# Patient Record
Sex: Male | Born: 1970 | Race: White | Hispanic: No | Marital: Married | Smoking: Former smoker
Health system: Southern US, Community
[De-identification: ages and names within clinical notes are randomized; demographics above are authoritative.]

## PROBLEM LIST (undated history)

## (undated) DIAGNOSIS — K219 Gastro-esophageal reflux disease without esophagitis: Secondary | ICD-10-CM

## (undated) DIAGNOSIS — K4090 Unilateral inguinal hernia, without obstruction or gangrene, not specified as recurrent: Secondary | ICD-10-CM

## (undated) DIAGNOSIS — Z8249 Family history of ischemic heart disease and other diseases of the circulatory system: Secondary | ICD-10-CM

## (undated) DIAGNOSIS — M7660 Achilles tendinitis, unspecified leg: Secondary | ICD-10-CM

## (undated) DIAGNOSIS — Z8719 Personal history of other diseases of the digestive system: Secondary | ICD-10-CM

## (undated) HISTORY — DX: Achilles tendinitis, unspecified leg: M76.60

## (undated) HISTORY — DX: Unilateral inguinal hernia, without obstruction or gangrene, not specified as recurrent: K40.90

## (undated) HISTORY — DX: Family history of ischemic heart disease and other diseases of the circulatory system: Z82.49

---

## 2005-02-18 ENCOUNTER — Ambulatory Visit: Payer: Self-pay | Admitting: Family Medicine

## 2005-12-14 ENCOUNTER — Ambulatory Visit: Payer: Self-pay | Admitting: Family Medicine

## 2006-04-20 ENCOUNTER — Emergency Department (HOSPITAL_COMMUNITY): Admission: EM | Admit: 2006-04-20 | Discharge: 2006-04-20 | Payer: Self-pay | Admitting: Emergency Medicine

## 2006-11-19 ENCOUNTER — Emergency Department: Payer: Self-pay | Admitting: Emergency Medicine

## 2006-12-25 ENCOUNTER — Ambulatory Visit: Payer: Self-pay | Admitting: Family Medicine

## 2006-12-26 ENCOUNTER — Ambulatory Visit: Payer: Self-pay | Admitting: Family Medicine

## 2007-08-15 ENCOUNTER — Encounter (INDEPENDENT_AMBULATORY_CARE_PROVIDER_SITE_OTHER): Payer: Self-pay | Admitting: *Deleted

## 2008-08-18 ENCOUNTER — Ambulatory Visit: Payer: Self-pay | Admitting: Family Medicine

## 2008-08-18 DIAGNOSIS — J309 Allergic rhinitis, unspecified: Secondary | ICD-10-CM | POA: Insufficient documentation

## 2008-08-18 DIAGNOSIS — J019 Acute sinusitis, unspecified: Secondary | ICD-10-CM

## 2008-12-19 ENCOUNTER — Emergency Department (HOSPITAL_COMMUNITY): Admission: EM | Admit: 2008-12-19 | Discharge: 2008-12-19 | Payer: Self-pay | Admitting: Emergency Medicine

## 2015-04-28 ENCOUNTER — Ambulatory Visit (INDEPENDENT_AMBULATORY_CARE_PROVIDER_SITE_OTHER): Payer: BC Managed Care – PPO | Admitting: Podiatry

## 2015-04-28 ENCOUNTER — Ambulatory Visit (INDEPENDENT_AMBULATORY_CARE_PROVIDER_SITE_OTHER): Payer: BC Managed Care – PPO

## 2015-04-28 ENCOUNTER — Encounter: Payer: Self-pay | Admitting: Podiatry

## 2015-04-28 VITALS — BP 139/98 | HR 69 | Resp 16 | Ht 71.0 in | Wt 200.0 lb

## 2015-04-28 DIAGNOSIS — M722 Plantar fascial fibromatosis: Secondary | ICD-10-CM | POA: Diagnosis not present

## 2015-04-28 DIAGNOSIS — M7662 Achilles tendinitis, left leg: Secondary | ICD-10-CM | POA: Diagnosis not present

## 2015-04-28 DIAGNOSIS — M7661 Achilles tendinitis, right leg: Secondary | ICD-10-CM | POA: Diagnosis not present

## 2015-04-28 NOTE — Patient Instructions (Signed)

## 2015-04-30 ENCOUNTER — Encounter: Payer: Self-pay | Admitting: Podiatry

## 2015-04-30 NOTE — Progress Notes (Signed)
Subjective:     Patient ID: Jose Pineda, male   DOB: [birth year], 44 y.o.   MRN: [id number]  HPI 44 year old male presents the office today with concerns of bilateral foot pain with a right slightly worse than left. He states he has had problems with his wife. 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 He states the majority of his pain at this time is localized to the back of his heel for which she points the Achilles tendon. He does state the mornings when he gets of the area feels tight when he has pain to the area and as he walks some and is relieved. He is tried multiple shoe gear changes. He states he'll get a pair shoes nail to good for a little while before they wear out. Denies any history of injury or trauma to the area and denies any swelling or redness over the area. Denies any tendon or numbness. No other complaints at this time.  Review of Systems  All other systems reviewed and are negative.      Objective:   Physical Exam AAO x3, NAD DP/PT pulses palpable bilaterally, CRT less than 3 seconds Protective sensation intact with Simms Weinstein monofilament, vibratory sensation intact, Achilles tendon reflex intact There is mild tenderness to palpation along the posterior aspect of the foot overlying the Achilles tendon and along the insertion of the calcaneus. There is no defect noted within the Achilles tendon and Janee Mornhompson test is performed which is negative. There is no pain on the course last insertion of the plantar fascia. There is no other areas of tenderness to bilateral foot/ankle. There is a decrease in medial arch height upon weightbearing with the right side slightly worse than left. There is equinus present. Ankle joint, subtalar joint range of motion is intact without any pain. No other areas of tenderness to bilateral lower extremities. MMT 5/5, ROM WNL.  No open lesions or pre-ulcerative lesions.  No overlying edema,  erythema, increase in warmth to bilateral lower extremities.  No pain with calf compression, swelling, warmth, erythema bilaterally.      Assessment:     44 year old male with Achilles tendinitis bilaterally    Plan:     -X-rays were obtained and reviewed with the patient.  -Treatment options discussed including all alternatives, risks, and complications -Discussed likely etiology of his symptoms. -Discussed stretching exercises to be performed the daily basis. Ice to the area. -Dispensed night splint. -He brought and multiple shoes for evaluation. We went through appropriate shoe gear. Also discussed with him orthotics. He will likely purchase over-the-counter orthotics I discussed with him what to look form purchasing these. I recommended him to go to [facility] in [place] to be fitted for an appropriate shoe. -Follow-up 4 weeks or sooner if any problems arise. In the meantime, encouraged to call the office with any questions, concerns, change in symptoms.   Ovid CurdMatthew Bita Cartwright, DPM

## 2015-05-26 ENCOUNTER — Ambulatory Visit (INDEPENDENT_AMBULATORY_CARE_PROVIDER_SITE_OTHER): Payer: BC Managed Care – PPO | Admitting: Podiatry

## 2015-05-26 DIAGNOSIS — M7661 Achilles tendinitis, right leg: Secondary | ICD-10-CM | POA: Diagnosis not present

## 2015-05-26 DIAGNOSIS — M7662 Achilles tendinitis, left leg: Secondary | ICD-10-CM | POA: Diagnosis not present

## 2015-05-26 DIAGNOSIS — B078 Other viral warts: Secondary | ICD-10-CM

## 2015-05-26 DIAGNOSIS — L570 Actinic keratosis: Secondary | ICD-10-CM | POA: Diagnosis not present

## 2015-05-26 DIAGNOSIS — B079 Viral wart, unspecified: Secondary | ICD-10-CM | POA: Diagnosis not present

## 2015-05-28 NOTE — Progress Notes (Signed)
Patient ID: Jose Pineda, male   DOB: December 12, 1970, 44 y.o.   MRN: [id number]  Subjective: 44 year old male presents the office today for follow-up evaluation of bilateral heel pain, Achilles tendinitis which he states is "a lot better". He states that since last appointment he is continue the stretching exercises on a daily basis as well as when the night splint. He is also purchased new shoes and inserts which seems to help as well. Denies any swelling or redness. Denies any numbness or treatment. He also states on the left fourth toe he is concerned that he may have a wart. The area is painful particularly with pressure. Denies any redness or drainage. No prior treatment No other complaints at this time.   Objective: AAO 3, NAD  Neurovascular status unchanged  There is slight tenderness palpation along the posterior aspect of the calcaneus on the insertion of the Achilles tendon. There is no pain along the course of the Achilles tendon. No defect is noted. Thompson test is negative. There is mild discomfort upon palpation to the plantar medial tubercle of the calcaneus the insertion of the plantar fascia on the right side. There is no pain along the left. There is no pain along the course the plantar fascial bilaterally. No pain with lateral compression of the calcaneus.  The distal aspect of the plantar left fourth digit there is small annular hyperkeratotic lesion. Upon debridement there is a small amount of pinpoint bleeding and there is evidence of verruca. There is no surrounding erythema. No other lesions identified.  No overlying edema, erythema, increase in warmth to bilateral lower extremities. No other areas of tenderness to bilateral lower extremity.  No pain with calf compression, swelling, warmth, erythema.   Assessment:  44 year old male with resolving heel pain; left fourth digit verruca   Plan: -Treatment options discussed including all alternatives, risks, and complications -In  regards the heel pain recommended continue stretching, icing activities as well as shoe gear modifications and orthotics. For the plantar fasciitis. Did discuss a steroid injection however he wishes to hold off on that at this time. I asked discussed a Medrol Dosepak he wishes to hold off. Continue anti-inflammatories as needed. -The lesion on the left fourth digit sharply debrided. Cantharone was applied followed by an occlusive bandage. Post procedure instructions were discussed with the patient. Monitor for any clinical signs or symptoms of infection and directed to call the office immediately should any occur or go to the ER. -Follow-up 4 weeks or sooner if any problems arise. In the meantime, encouraged to call the office with any questions, concerns, change in symptoms.   Ovid Curd, DPM

## 2015-06-25 ENCOUNTER — Ambulatory Visit (INDEPENDENT_AMBULATORY_CARE_PROVIDER_SITE_OTHER): Payer: BC Managed Care – PPO | Admitting: Podiatry

## 2015-06-25 ENCOUNTER — Encounter: Payer: Self-pay | Admitting: Podiatry

## 2015-06-25 VITALS — BP 122/73 | HR 77 | Resp 18

## 2015-06-25 DIAGNOSIS — M7662 Achilles tendinitis, left leg: Secondary | ICD-10-CM

## 2015-06-25 DIAGNOSIS — B070 Plantar wart: Secondary | ICD-10-CM | POA: Diagnosis not present

## 2015-06-25 DIAGNOSIS — M7661 Achilles tendinitis, right leg: Secondary | ICD-10-CM | POA: Diagnosis not present

## 2015-06-25 DIAGNOSIS — M722 Plantar fascial fibromatosis: Secondary | ICD-10-CM

## 2015-06-25 DIAGNOSIS — B078 Other viral warts: Secondary | ICD-10-CM

## 2015-06-25 DIAGNOSIS — B079 Viral wart, unspecified: Secondary | ICD-10-CM

## 2015-06-25 MED ORDER — METHYLPREDNISOLONE 4 MG PO TBPK: ORAL_TABLET | ORAL | Status: DC

## 2015-06-25 NOTE — Progress Notes (Signed)
Patient ID: Jose Pineda, male   DOB: 12-14-1970, 44 y.o.   MRN: [id number]  Subjective: 44 year old male presents the office today for follow-up evaluation of bilateral heel pain, Achilles tendinitis. He states that overall he is doing better although he does continue to have some discomfort to the bottoms and back of his heel mostly on the right side. He is also returned back to work over the last 2 weeks he is on his feet more. He is purchased new shoes as well as inserts and continue stretching icing activities. He denies any swelling or redness. No numbness or tingling. Also states he continues to have the wart on the left fourth toe although smaller denies any pain to the area. Had no problems at the last Cantharone application. Denies any drainage or redness from the area. No other complaints at this time.  Objective: AAO 3, NAD  Neurovascular status unchanged   there is tenderness to palpation overlying the plantar medial tubercle of the calcaneus at the insertion the plantar fascia mostly on the right foot and to a lesser degree the left foot. There is no pain on the course of plantar fascial bilaterally and the plantar fascia appears to be intact. There is mild discomfort on the posterior aspect of the calcaneus on the insertion of the Achilles tendon on the right foot. There is mild equinus present. There is no defect noted in Kilgore test is negative. The distal aspect of the plantar left fourth digit there is small annular hyperkeratotic lesion. Upon debridement there is  pinpoint bleeding and there is evidence of verruca. The area does appear to be smaller. There is no surrounding erythema. No other lesions identified.  No overlying edema, erythema, increase in warmth to bilateral lower extremities.  No other areas of tenderness to bilateral lower extremity.  No pain with calf compression, swelling, warmth, erythema.   Assessment: 44 year old male with resolving heel pain; left fourth  digit verruca   Plan: -Treatment options discussed including all alternatives, risks, and complications -For the heel pain I discussed him steroid injections however he wishes to hold off. Discussed with him Medrol Dosepak which she would like to try. Prescribed this to him. Discussed risks, locations directed to call the office if any occur. Continue his shoe gear modifications and orthotics. Continue stretching icing and activity daily. -The lesion on the left fourth digit was sharply debrided. Cantharone was applied followed by an occlusive bandage. Post procedure instructions were discussed with the patient. Monitor for any clinical signs or symptoms of infection and directed to call the office immediately should any occur or go to the ER. -Follow-up 4 weeks if symptoms continue  or sooner if any problems arise. In the meantime, encouraged to call the office with any questions, concerns, change in symptoms.   Ovid Curd, DPM

## 2015-07-17 ENCOUNTER — Other Ambulatory Visit: Payer: Self-pay | Admitting: Gastroenterology

## 2015-07-17 ENCOUNTER — Other Ambulatory Visit: Payer: Self-pay | Admitting: Physician Assistant

## 2015-07-17 ENCOUNTER — Other Ambulatory Visit (HOSPITAL_COMMUNITY): Payer: Self-pay | Admitting: Gastroenterology

## 2015-07-17 DIAGNOSIS — K219 Gastro-esophageal reflux disease without esophagitis: Secondary | ICD-10-CM

## 2015-07-23 ENCOUNTER — Ambulatory Visit: Payer: BC Managed Care – PPO | Admitting: Podiatry

## 2015-07-30 NOTE — Addendum Note (Signed)
Addended by: Dorena Cookey on: 07/30/2015 01:06 PM   Modules accepted: Orders

## 2015-08-03 ENCOUNTER — Encounter (HOSPITAL_COMMUNITY)
Admission: RE | Disposition: A | Discharge status: Home / Self Care | Payer: Self-pay | Source: Ambulatory Visit | Attending: Gastroenterology

## 2015-08-03 ENCOUNTER — Ambulatory Visit (HOSPITAL_COMMUNITY)
Admission: RE | Admit: 2015-08-03 | Discharge: 2015-08-03 | Disposition: A | Discharge status: Home / Self Care | Payer: BC Managed Care – PPO | Source: Ambulatory Visit | Attending: Gastroenterology | Admitting: Gastroenterology

## 2015-08-03 DIAGNOSIS — K224 Dyskinesia of esophagus: Secondary | ICD-10-CM | POA: Insufficient documentation

## 2015-08-03 HISTORY — PX: ESOPHAGEAL MANOMETRY: SHX5429

## 2015-08-03 SURGERY — MANOMETRY, ESOPHAGUS

## 2015-08-03 MED ORDER — LIDOCAINE VISCOUS 2 % MT SOLN
OROMUCOSAL | Status: AC
  Filled 2015-08-03: qty 15

## 2015-08-03 SURGICAL SUPPLY — 2 items
FACESHIELD LNG OPTICON STERILE (SAFETY) IMPLANT
GLOVE BIO SURGEON STRL SZ8 (GLOVE) ×6 IMPLANT

## 2015-08-04 ENCOUNTER — Encounter (HOSPITAL_COMMUNITY): Payer: Self-pay | Admitting: Gastroenterology

## 2015-08-14 ENCOUNTER — Other Ambulatory Visit: Payer: Self-pay | Admitting: Surgery

## 2015-08-14 DIAGNOSIS — K2100 Gastro-esophageal reflux disease with esophagitis, without bleeding: Secondary | ICD-10-CM

## 2015-08-14 DIAGNOSIS — K449 Diaphragmatic hernia without obstruction or gangrene: Secondary | ICD-10-CM

## 2015-08-21 ENCOUNTER — Ambulatory Visit
Admission: RE | Admit: 2015-08-21 | Discharge: 2015-08-21 | Disposition: A | Discharge status: Home / Self Care | Payer: BC Managed Care – PPO | Source: Ambulatory Visit | Attending: Surgery | Admitting: Surgery

## 2015-08-21 DIAGNOSIS — K449 Diaphragmatic hernia without obstruction or gangrene: Secondary | ICD-10-CM

## 2015-08-21 DIAGNOSIS — K2100 Gastro-esophageal reflux disease with esophagitis, without bleeding: Secondary | ICD-10-CM

## 2015-10-12 NOTE — Patient Instructions (Addendum)
Jose Pineda  10/12/2015   Your procedure is scheduled on: 10/16/2015    [facility]  [place]tr[place]vators to 3rd floor to  Short Stay Center at     0530 AM.  Call this number if you have problems the morning of surgery [phone]   Remember: ONLY 1 PERSON MAY GO WITH YOU TO SHORT STAY TO GET  READY MORNING OF YOUR SURGERY.  Do not eat food or drink liquids :After Midnight.     Take these medicines the morning of surgery with A SIP OF WATER: Zyrtec if needed, Flonase if needed, Protonix                                 You may not have any metal on your body including hair pins and              piercings  Do not wear jewelry,  lotions, powders or perfumes, deodorant                          Men may shave face and neck.   Do not bring valuables to the hospital. [facility] IS NOT             RESPONSIBLE   FOR VALUABLES.  Contacts, dentures or bridgework may not be worn into surgery.  Leave suitcase in the car. After surgery it may be brought to your room.       Special Instructions: coughing and deep breathing exercises, leg exercises               Please read over the following fact sheets you were given: _____________________________________________________________________             [facility] - Preparing for Surgery Before surgery, you can play an important role.  Because skin is not sterile, your skin needs to be as free of germs as possible.  You can reduce the number of germs on your skin by washing with CHG (chlorahexidine gluconate) soap before surgery.  CHG is an antiseptic cleaner which kills germs and bonds with the skin to continue killing germs even after washing. Please DO NOT use if you have an allergy to CHG or antibacterial soaps.  If your skin becomes reddened/irritated stop using the CHG and inform your nurse when you arrive at Short Stay. Do not shave (including legs and underarms) for at least 48 hours  prior to the first CHG shower.  You may shave your face/neck. Please follow these instructions carefully:  1.  Shower with CHG Soap the night before surgery and the  morning of Surgery.  2.  If you choose to wash your hair, wash your hair first as usual with your  normal  shampoo.  3.  After you shampoo, rinse your hair and body thoroughly to remove the  shampoo.                           4.  Use CHG as you would any other liquid soap.  You can apply chg directly  to the skin and wash                       Gently with a scrungie  or clean washcloth.  5.  Apply the CHG Soap to your body ONLY FROM THE NECK DOWN.   Do not use on face/ open                           Wound or open sores. Avoid contact with eyes, ears mouth and genitals (private parts).                       Wash face,  Genitals (private parts) with your normal soap.             6.  Wash thoroughly, paying special attention to the area where your surgery  will be performed.  7.  Thoroughly rinse your body with warm water from the neck down.  8.  DO NOT shower/wash with your normal soap after using and rinsing off  the CHG Soap.                9.  Pat yourself dry with a clean towel.            10.  Wear clean pajamas.            11.  Place clean sheets on your bed the night of your first shower and do not  sleep with pets. Day of Surgery : Do not apply any lotions/deodorants the morning of surgery.  Please wear clean clothes to the hospital/surgery center.  FAILURE TO FOLLOW THESE INSTRUCTIONS MAY RESULT IN THE CANCELLATION OF YOUR SURGERY PATIENT SIGNATURE_________________________________  NURSE SIGNATURE__________________________________  ________________________________________________________________________

## 2015-10-13 NOTE — Anesthesia Preprocedure Evaluation (Addendum)
Anesthesia Evaluation  Patient identified by MRN, date of birth, ID band Patient awake    Reviewed: Allergy & Precautions, NPO status , Patient's Chart, lab work & pertinent test results  Airway Mallampati: II   Neck ROM: Full    Dental  (+) Dental Advisory Given   Pulmonary neg pulmonary ROS, former smoker,    breath sounds clear to auscultation       Cardiovascular negative cardio ROS   Rhythm:Regular     Neuro/Psych negative neurological ROS  negative psych ROS   GI/Hepatic Neg liver ROS, hiatal hernia, GERD  Medicated,  Endo/Other  negative endocrine ROS  Renal/GU negative Renal ROS  negative genitourinary   Musculoskeletal negative musculoskeletal ROS (+)   Abdominal (+)  Abdomen: soft.    Peds negative pediatric ROS (+)  Hematology negative hematology ROS (+) 14/42   Anesthesia Other Findings   Reproductive/Obstetrics negative OB ROS                           Anesthesia Physical Anesthesia Plan  ASA: II  Anesthesia Plan: General   Post-op Pain Management:    Induction: Intravenous  Airway Management Planned: Oral ETT  Additional Equipment:   Intra-op Plan:   Post-operative Plan: Extubation in OR  Informed Consent: I have reviewed the patients History and Physical, chart, labs and discussed the procedure including the risks, benefits and alternatives for the proposed anesthesia with the patient or authorized representative who has indicated his/her understanding and acceptance.     Plan Discussed with:   Anesthesia Plan Comments:         Anesthesia Quick Evaluation

## 2015-10-14 ENCOUNTER — Ambulatory Visit: Payer: Self-pay | Admitting: Surgery

## 2015-10-14 ENCOUNTER — Encounter (HOSPITAL_COMMUNITY)
Admission: RE | Admit: 2015-10-14 | Discharge: 2015-10-14 | Disposition: A | Discharge status: Home / Self Care | Payer: BC Managed Care – PPO | Source: Ambulatory Visit | Attending: Surgery | Admitting: Surgery

## 2015-10-14 ENCOUNTER — Encounter (HOSPITAL_COMMUNITY): Payer: Self-pay

## 2015-10-14 HISTORY — DX: Gastro-esophageal reflux disease without esophagitis: K21.9

## 2015-10-14 HISTORY — DX: Personal history of other diseases of the digestive system: Z87.19

## 2015-10-14 LAB — CBC
HCT: 42.1 % (ref 39.0–52.0)
Hemoglobin: 14.5 g/dL (ref 13.0–17.0)
MCH: 30.7 pg (ref 26.0–34.0)
MCHC: 34.4 g/dL (ref 30.0–36.0)
MCV: 89 fL (ref 78.0–100.0)
PLATELETS: 265 10*3/uL (ref 150–400)
RBC: 4.73 MIL/uL (ref 4.22–5.81)
RDW: 13.1 % (ref 11.5–15.5)
WBC: 8.1 10*3/uL (ref 4.0–10.5)

## 2015-10-14 NOTE — Progress Notes (Signed)
Patient 's mother recently in hospital with numerous blood clots per patient. Also patient has family history of blood clots in family.

## 2015-10-14 NOTE — H&P (Signed)
Jose Pineda 08/12/2015 4:08 PM Location: [facility] Patient #: [id number] DOB: 06/25/1971 Married / Language: English / Race: White Male   History of Present Illness  The patient is a 44 year old male who presents with a hiatal hernia. 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 All her by Dr. Amy Bedsole and was seen by Dr. Hayes who referred him. He has short segment Barrett's and GERD and wants to try to get off the PPIs that he is on every day. He has reflux all the time worse sometimes with exercise. He's never had any prior abdominal surgery. I went over Nissen fundoplication and laparoscopic hiatal hernia repair with him in detail drawing them pictures and also giving him a booklet. We talked about potential risks and complications. He wants to go ahead and get scheduled. Before we do this I will like to get an upper GI series. He comes with a manometry done that shows some weekend a lower esophageal sphincter and some generally not great peristalsis waves.   Other Problems  Gastroesophageal Reflux Disease  Past Surgical History No pertinent past surgical history  Diagnostic Studies History  Colonoscopy never  Allergies  No Known Drug Allergies10/10/2015  Medication History  Pantoprazole Sodium (40MG Tablet DR, Oral) Active. Ranitidine HCl (150MG Capsule, Oral) Active. Medications Reconciled  Social History  Caffeine use Coffee. No alcohol use No drug use Tobacco use Former smoker.  Family History Arthritis Father, Mother. Cancer Mother. Diabetes Mellitus Father. Hypertension Father.    Review of Systems (Chemira Jones CMA; 08/12/2015 4:08 PM) General Not Present- Appetite Loss, Chills, Fatigue, Fever, Night Sweats, Weight Gain and Weight Loss. Skin Not Present- Change in Wart/Mole, Dryness, Hives, Jaundice, New Lesions, Non-Healing Wounds, Rash and Ulcer. HEENT Not  Present- Earache, Hearing Loss, Hoarseness, Nose Bleed, Oral Ulcers, Ringing in the Ears, Seasonal Allergies, Sinus Pain, Sore Throat, Visual Disturbances, Wears glasses/contact lenses and Yellow Eyes. Respiratory Not Present- Bloody sputum, Chronic Cough, Difficulty Breathing, Snoring and Wheezing. Breast Not Present- Breast Mass, Breast Pain, Nipple Discharge and Skin Changes. Cardiovascular Not Present- Chest Pain, Difficulty Breathing Lying Down, Leg Cramps, Palpitations, Rapid Heart Rate, Shortness of Breath and Swelling of Extremities. Gastrointestinal Not Present- Abdominal Pain, Bloating, Bloody Stool, Change in Bowel Habits, Chronic diarrhea, Constipation, Difficulty Swallowing, Excessive gas, Gets full quickly at meals, Hemorrhoids, Indigestion, Nausea, Rectal Pain and Vomiting. Male Genitourinary Not Present- Blood in Urine, Change in Urinary Stream, Frequency, Impotence, Nocturia, Painful Urination, Urgency and Urine Leakage. Musculoskeletal Not Present- Back Pain, Joint Pain, Joint Stiffness, Muscle Pain, Muscle Weakness and Swelling of Extremities. Neurological Not Present- Decreased Memory, Fainting, Headaches, Numbness, Seizures, Tingling, Tremor, Trouble walking and Weakness. Psychiatric Not Present- Anxiety, Bipolar, Change in Sleep Pattern, Depression, Fearful and Frequent crying. Endocrine Not Present- Cold Intolerance, Excessive Hunger, Hair Changes, Heat Intolerance, Hot flashes and New Diabetes. Hematology Not Present- Easy Bruising, Excessive bleeding, Gland problems, HIV and Persistent Infections.  Weight: 209.2 lb Height: 71in Body Surface Area: 2.18 m Body Mass Index: 29.18 kg/m      Physical ExamGeneral Note: HEENT normal Neck supple Chest clear Heart SR without murmurs Abdomen nontender Ext FROM without edema or clubbing Neuro alert and oriented x 3     Assessment & Plan  HIATAL HERNIA WITH GERD AND ESOPHAGITIS (K44.9)--plan lap Nissen  fundoplication  Matt B. Cynthia Stainback, MD, FACS  [facility] [phone] beeper [phone]  10/14/2015 3:55 PM   

## 2015-10-14 NOTE — Progress Notes (Signed)
Patient developed head cold prior to preop appointment on 10/14/2015.  Patient states Dr Daphine DeutscherMartin office aware.  Instructed patient to notify Dr Daphine DeutscherMartin and to go to Urgent Care if symptoms become worse.

## 2015-10-16 ENCOUNTER — Encounter (HOSPITAL_COMMUNITY): Payer: Self-pay | Admitting: *Deleted

## 2015-10-16 ENCOUNTER — Inpatient Hospital Stay (HOSPITAL_COMMUNITY): Payer: BC Managed Care – PPO | Admitting: Anesthesiology

## 2015-10-16 ENCOUNTER — Inpatient Hospital Stay (HOSPITAL_COMMUNITY)
Admission: RE | Admit: 2015-10-16 | Discharge: 2015-10-19 | DRG: 328 | Disposition: A | Discharge status: Home / Self Care | Payer: BC Managed Care – PPO | Source: Ambulatory Visit | Attending: Surgery | Admitting: Surgery

## 2015-10-16 ENCOUNTER — Encounter (HOSPITAL_COMMUNITY)
Admission: RE | Disposition: A | Discharge status: Home / Self Care | Payer: Self-pay | Source: Ambulatory Visit | Attending: Surgery

## 2015-10-16 DIAGNOSIS — Z833 Family history of diabetes mellitus: Secondary | ICD-10-CM

## 2015-10-16 DIAGNOSIS — Z809 Family history of malignant neoplasm, unspecified: Secondary | ICD-10-CM | POA: Diagnosis not present

## 2015-10-16 DIAGNOSIS — K210 Gastro-esophageal reflux disease with esophagitis: Secondary | ICD-10-CM | POA: Diagnosis present

## 2015-10-16 DIAGNOSIS — Z87891 Personal history of nicotine dependence: Secondary | ICD-10-CM

## 2015-10-16 DIAGNOSIS — K2270 Barrett's esophagus without dysplasia: Secondary | ICD-10-CM | POA: Diagnosis present

## 2015-10-16 DIAGNOSIS — Z8249 Family history of ischemic heart disease and other diseases of the circulatory system: Secondary | ICD-10-CM

## 2015-10-16 DIAGNOSIS — K449 Diaphragmatic hernia without obstruction or gangrene: Principal | ICD-10-CM | POA: Diagnosis present

## 2015-10-16 DIAGNOSIS — Z01812 Encounter for preprocedural laboratory examination: Secondary | ICD-10-CM | POA: Diagnosis not present

## 2015-10-16 DIAGNOSIS — Z9889 Other specified postprocedural states: Secondary | ICD-10-CM

## 2015-10-16 HISTORY — PX: LAPAROSCOPIC NISSEN FUNDOPLICATION: SHX1932

## 2015-10-16 LAB — CBC
HEMATOCRIT: 40.3 % (ref 39.0–52.0)
Hemoglobin: 13.7 g/dL (ref 13.0–17.0)
MCH: 30.4 pg (ref 26.0–34.0)
MCHC: 34 g/dL (ref 30.0–36.0)
MCV: 89.6 fL (ref 78.0–100.0)
PLATELETS: 233 10*3/uL (ref 150–400)
RBC: 4.5 MIL/uL (ref 4.22–5.81)
RDW: 13.3 % (ref 11.5–15.5)
WBC: 11.8 10*3/uL — AB (ref 4.0–10.5)

## 2015-10-16 LAB — CREATININE, SERUM
Creatinine, Ser: 0.91 mg/dL (ref 0.61–1.24)
GFR calc non Af Amer: >60 mL/min (ref >60)

## 2015-10-16 SURGERY — FUNDOPLICATION, NISSEN, LAPAROSCOPIC
Anesthesia: General

## 2015-10-16 MED ORDER — SODIUM CHLORIDE 0.9 % IJ SOLN
INTRAMUSCULAR | Status: AC
Start: 2015-10-16 — End: 2015-10-16
  Filled 2015-10-16: qty 10

## 2015-10-16 MED ORDER — DIPHENHYDRAMINE HCL 50 MG/ML IJ SOLN: 25.0000 mg | Freq: Four times a day (QID) | INTRAMUSCULAR | Status: DC | PRN

## 2015-10-16 MED ORDER — ONDANSETRON 4 MG PO TBDP: 4.0000 mg | ORAL_TABLET | Freq: Four times a day (QID) | ORAL | Status: DC | PRN

## 2015-10-16 MED ORDER — ONDANSETRON HCL 4 MG/2ML IJ SOLN
4.0000 mg | Freq: Four times a day (QID) | INTRAMUSCULAR | Status: DC | PRN
  Administered 2015-10-16: 4 mg via INTRAVENOUS
  Filled 2015-10-16: qty 2

## 2015-10-16 MED ORDER — ONDANSETRON HCL 4 MG/2ML IJ SOLN
INTRAMUSCULAR | Status: AC
  Filled 2015-10-16: qty 2

## 2015-10-16 MED ORDER — BUPIVACAINE LIPOSOME 1.3 % IJ SUSP
20.0000 mL | Freq: Once | INTRAMUSCULAR | Status: AC
  Administered 2015-10-16: 20 mL
  Filled 2015-10-16: qty 20

## 2015-10-16 MED ORDER — DIPHENHYDRAMINE HCL 25 MG PO CAPS: 25.0000 mg | ORAL_CAPSULE | Freq: Four times a day (QID) | ORAL | Status: DC | PRN

## 2015-10-16 MED ORDER — HEPARIN SODIUM (PORCINE) 5000 UNIT/ML IJ SOLN
5000.0000 [IU] | Freq: Once | INTRAMUSCULAR | Status: AC
  Administered 2015-10-16: 5000 [IU] via SUBCUTANEOUS
  Filled 2015-10-16: qty 1

## 2015-10-16 MED ORDER — CHLORHEXIDINE GLUCONATE 4 % EX LIQD: 1.0000 "application " | Freq: Once | CUTANEOUS | Status: DC

## 2015-10-16 MED ORDER — FENTANYL CITRATE (PF) 100 MCG/2ML IJ SOLN: 25.0000 ug | INTRAMUSCULAR | Status: DC | PRN

## 2015-10-16 MED ORDER — MIDAZOLAM HCL 5 MG/5ML IJ SOLN
INTRAMUSCULAR | Status: DC | PRN
  Administered 2015-10-16: 2 mg via INTRAVENOUS

## 2015-10-16 MED ORDER — LACTATED RINGERS IR SOLN
Status: DC | PRN
  Administered 2015-10-16: 1

## 2015-10-16 MED ORDER — CEFOTETAN DISODIUM-DEXTROSE 2-2.08 GM-% IV SOLR
INTRAVENOUS | Status: AC
  Filled 2015-10-16: qty 50

## 2015-10-16 MED ORDER — PROMETHAZINE HCL 25 MG/ML IJ SOLN: 6.2500 mg | INTRAMUSCULAR | Status: DC | PRN

## 2015-10-16 MED ORDER — PROPOFOL 10 MG/ML IV BOLUS
INTRAVENOUS | Status: AC
  Filled 2015-10-16: qty 20

## 2015-10-16 MED ORDER — POTASSIUM CHLORIDE IN NACL 20-0.45 MEQ/L-% IV SOLN
INTRAVENOUS | Status: DC
  Administered 2015-10-16: 100 mL/h via INTRAVENOUS
  Administered 2015-10-17 – 2015-10-18 (×2): via INTRAVENOUS
  Filled 2015-10-16 (×8): qty 1000

## 2015-10-16 MED ORDER — SODIUM CHLORIDE 0.9 % IJ SOLN
INTRAMUSCULAR | Status: DC | PRN
  Administered 2015-10-16: 10 mL via INTRAVENOUS

## 2015-10-16 MED ORDER — HEPARIN SODIUM (PORCINE) 5000 UNIT/ML IJ SOLN
5000.0000 [IU] | Freq: Three times a day (TID) | INTRAMUSCULAR | Status: DC
  Administered 2015-10-16 – 2015-10-19 (×9): 5000 [IU] via SUBCUTANEOUS
  Filled 2015-10-16 (×12): qty 1

## 2015-10-16 MED ORDER — ROCURONIUM BROMIDE 100 MG/10ML IV SOLN
INTRAVENOUS | Status: AC
  Filled 2015-10-16: qty 1

## 2015-10-16 MED ORDER — ACETAMINOPHEN 10 MG/ML IV SOLN
1000.0000 mg | Freq: Four times a day (QID) | INTRAVENOUS | Status: AC
  Administered 2015-10-16 – 2015-10-17 (×4): 1000 mg via INTRAVENOUS
  Filled 2015-10-16 (×4): qty 100

## 2015-10-16 MED ORDER — LIDOCAINE HCL (CARDIAC) 20 MG/ML IV SOLN
INTRAVENOUS | Status: DC | PRN
  Administered 2015-10-16: 100 mg via INTRAVENOUS

## 2015-10-16 MED ORDER — MEPERIDINE HCL 50 MG/ML IJ SOLN: 6.2500 mg | INTRAMUSCULAR | Status: DC | PRN

## 2015-10-16 MED ORDER — ONDANSETRON HCL 4 MG/2ML IJ SOLN
INTRAMUSCULAR | Status: DC | PRN
  Administered 2015-10-16: 4 mg via INTRAVENOUS

## 2015-10-16 MED ORDER — PROPOFOL 10 MG/ML IV BOLUS
INTRAVENOUS | Status: DC | PRN
  Administered 2015-10-16: 150 mg via INTRAVENOUS

## 2015-10-16 MED ORDER — MIDAZOLAM HCL 2 MG/2ML IJ SOLN
INTRAMUSCULAR | Status: AC
  Filled 2015-10-16: qty 2

## 2015-10-16 MED ORDER — SUGAMMADEX SODIUM 200 MG/2ML IV SOLN
INTRAVENOUS | Status: AC
  Filled 2015-10-16: qty 2

## 2015-10-16 MED ORDER — CEFOTETAN DISODIUM-DEXTROSE 2-2.08 GM-% IV SOLR
2.0000 g | INTRAVENOUS | Status: AC
  Administered 2015-10-16: 2 g via INTRAVENOUS

## 2015-10-16 MED ORDER — SUFENTANIL CITRATE 50 MCG/ML IV SOLN
INTRAVENOUS | Status: AC
  Filled 2015-10-16: qty 1

## 2015-10-16 MED ORDER — PANTOPRAZOLE SODIUM 40 MG IV SOLR
40.0000 mg | Freq: Every day | INTRAVENOUS | Status: DC
  Administered 2015-10-16 – 2015-10-18 (×3): 40 mg via INTRAVENOUS
  Filled 2015-10-16 (×4): qty 40

## 2015-10-16 MED ORDER — 0.9 % SODIUM CHLORIDE (POUR BTL) OPTIME
TOPICAL | Status: DC | PRN
  Administered 2015-10-16: 1000 mL

## 2015-10-16 MED ORDER — LACTATED RINGERS IV SOLN
INTRAVENOUS | Status: DC | PRN
  Administered 2015-10-16 (×2): via INTRAVENOUS

## 2015-10-16 MED ORDER — EPHEDRINE SULFATE 50 MG/ML IJ SOLN
INTRAMUSCULAR | Status: DC | PRN
  Administered 2015-10-16: 10 mg via INTRAVENOUS

## 2015-10-16 MED ORDER — GLYCOPYRROLATE 0.2 MG/ML IJ SOLN
INTRAMUSCULAR | Status: DC | PRN
  Administered 2015-10-16: 0.2 mg via INTRAVENOUS

## 2015-10-16 MED ORDER — SUGAMMADEX SODIUM 200 MG/2ML IV SOLN
INTRAVENOUS | Status: DC | PRN
  Administered 2015-10-16: 195 mg via INTRAVENOUS

## 2015-10-16 MED ORDER — ROCURONIUM BROMIDE 100 MG/10ML IV SOLN
INTRAVENOUS | Status: DC | PRN
  Administered 2015-10-16 (×2): 10 mg via INTRAVENOUS
  Administered 2015-10-16: 20 mg via INTRAVENOUS
  Administered 2015-10-16: 40 mg via INTRAVENOUS

## 2015-10-16 MED ORDER — HYDROMORPHONE HCL 1 MG/ML IJ SOLN
0.5000 mg | INTRAMUSCULAR | Status: DC | PRN
  Administered 2015-10-16 – 2015-10-19 (×6): 0.5 mg via INTRAVENOUS
  Filled 2015-10-16 (×6): qty 1

## 2015-10-16 MED ORDER — LIDOCAINE HCL (CARDIAC) 20 MG/ML IV SOLN
INTRAVENOUS | Status: AC
  Filled 2015-10-16: qty 5

## 2015-10-16 MED ORDER — SODIUM CHLORIDE 0.9 % IJ SOLN
INTRAMUSCULAR | Status: AC
  Filled 2015-10-16: qty 20

## 2015-10-16 MED ORDER — SUFENTANIL CITRATE 50 MCG/ML IV SOLN
INTRAVENOUS | Status: DC | PRN
  Administered 2015-10-16 (×2): 10 ug via INTRAVENOUS
  Administered 2015-10-16 (×6): 5 ug via INTRAVENOUS

## 2015-10-16 SURGICAL SUPPLY — 43 items
APPLIER CLIP ROT 10 11.4 M/L (STAPLE)
CABLE HIGH FREQUENCY MONO STRZ (ELECTRODE) ×3 IMPLANT
CLAMP ENDO BABCK 10MM (STAPLE) IMPLANT
CLIP APPLIE ROT 10 11.4 M/L (STAPLE) IMPLANT
COVER SURGICAL LIGHT HANDLE (MISCELLANEOUS) ×3 IMPLANT
DECANTER SPIKE VIAL GLASS SM (MISCELLANEOUS) ×3 IMPLANT
DEVICE SUT QUICK LOAD TK 5 (STAPLE) ×14 IMPLANT
DEVICE SUT TI-KNOT TK 5X26 (MISCELLANEOUS) ×2 IMPLANT
DEVICE SUTURE ENDOST 10MM (ENDOMECHANICALS) ×3 IMPLANT
DEVICE TI KNOT TK5 (MISCELLANEOUS) ×1
DEVICE TROCAR PUNCTURE CLOSURE (ENDOMECHANICALS) ×3 IMPLANT
DISSECTOR BLUNT TIP ENDO 5MM (MISCELLANEOUS) ×3 IMPLANT
DRAIN PENROSE 18X1/2 LTX STRL (DRAIN) ×3 IMPLANT
DRAPE LAPAROSCOPIC ABDOMINAL (DRAPES) ×3 IMPLANT
ELECT REM PT RETURN 9FT ADLT (ELECTROSURGICAL) ×3
ELECTRODE REM PT RTRN 9FT ADLT (ELECTROSURGICAL) ×1 IMPLANT
GLOVE BIOGEL M 8.0 STRL (GLOVE) ×3 IMPLANT
GOWN STRL REUS W/TWL XL LVL3 (GOWN DISPOSABLE) ×12 IMPLANT
GRASPER ENDO BABCOCK 10 (MISCELLANEOUS) IMPLANT
GRASPER ENDO BABCOCK 10MM (MISCELLANEOUS)
KIT BASIN OR (CUSTOM PROCEDURE TRAY) ×3 IMPLANT
QUICK LOAD TK 5 (STAPLE) ×7
SCISSORS LAP 5X45 EPIX DISP (ENDOMECHANICALS) ×3 IMPLANT
SET IRRIG TUBING LAPAROSCOPIC (IRRIGATION / IRRIGATOR) ×3 IMPLANT
SHEARS HARMONIC ACE PLUS 45CM (MISCELLANEOUS) ×3 IMPLANT
SLEEVE ADV FIXATION 12X100MM (TROCAR) IMPLANT
SLEEVE ADV FIXATION 5X100MM (TROCAR) ×9 IMPLANT
STAPLER VISISTAT 35W (STAPLE) ×3 IMPLANT
SUT SURGIDAC NAB ES-9 0 48 120 (SUTURE) ×24 IMPLANT
SUT VIC AB 4-0 SH 18 (SUTURE) ×3 IMPLANT
TIP INNERVISION DETACH 40FR (MISCELLANEOUS) IMPLANT
TIP INNERVISION DETACH 50FR (MISCELLANEOUS) IMPLANT
TIP INNERVISION DETACH 56FR (MISCELLANEOUS) ×3 IMPLANT
TIPS INNERVISION DETACH 40FR (MISCELLANEOUS)
TOWEL OR 17X26 10 PK STRL BLUE (TOWEL DISPOSABLE) ×6 IMPLANT
TOWEL OR NON WOVEN STRL DISP B (DISPOSABLE) IMPLANT
TRAY FOLEY W/METER SILVER 14FR (SET/KITS/TRAYS/PACK) IMPLANT
TRAY FOLEY W/METER SILVER 16FR (SET/KITS/TRAYS/PACK) IMPLANT
TRAY LAPAROSCOPIC (CUSTOM PROCEDURE TRAY) ×3 IMPLANT
TROCAR ADV FIXATION 12X100MM (TROCAR) ×3 IMPLANT
TROCAR ADV FIXATION 5X100MM (TROCAR) ×3 IMPLANT
TROCAR BLADELESS OPT 5 100 (ENDOMECHANICALS) ×3 IMPLANT
TUBING FILTER THERMOFLATOR (ELECTROSURGICAL) ×3 IMPLANT

## 2015-10-16 NOTE — H&P (View-Only) (Signed)
Jose HuhAlan K. Jose Pineda 08/12/2015 4:08 PM Location: [facility] Patient #: [id number] DOB: 1970/12/31 Married / Language: English / Race: White Male   History of Present Illness  The patient is a 44 year old male who presents with a hiatal hernia. 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 All her by Dr. Kerby NoraAmy Pineda and was seen by Dr. Madilyn FiremanHayes who referred him. He has short segment Barrett's and GERD and wants to try to get off the PPIs that he is on every day. He has reflux all the time worse sometimes with exercise. He's never had any prior abdominal surgery. I went over Nissen fundoplication and laparoscopic hiatal hernia repair with him in detail drawing them pictures and also giving him a booklet. We talked about potential risks and complications. He wants to go ahead and get scheduled. Before we do this I will like to get an upper GI series. He comes with a manometry done that shows some weekend a lower esophageal sphincter and some generally not great peristalsis waves.   Other Problems  Gastroesophageal Reflux Disease  Past Surgical History No pertinent past surgical history  Diagnostic Studies History  Colonoscopy never  Allergies  No Known Drug Allergies10/10/2015  Medication History  Pantoprazole Sodium (40MG  Tablet DR, Oral) Active. Ranitidine HCl (150MG  Capsule, Oral) Active. Medications Reconciled  Social History  Caffeine use Coffee. No alcohol use No drug use Tobacco use Former smoker.  Family History Arthritis Father, Mother. Cancer Mother. Diabetes Mellitus Father. Hypertension Father.    Review of Systems Doristine Devoid(Chemira Jones CMA; 08/12/2015 4:08 PM) General Not Present- Appetite Loss, Chills, Fatigue, Fever, Night Sweats, Weight Gain and Weight Loss. Skin Not Present- Change in Wart/Mole, Dryness, Hives, Jaundice, New Lesions, Non-Healing Wounds, Rash and Ulcer. HEENT Not  Present- Earache, Hearing Loss, Hoarseness, Nose Bleed, Oral Ulcers, Ringing in the Ears, Seasonal Allergies, Sinus Pain, Sore Throat, Visual Disturbances, Wears glasses/contact lenses and Yellow Eyes. Respiratory Not Present- Bloody sputum, Chronic Cough, Difficulty Breathing, Snoring and Wheezing. Breast Not Present- Breast Mass, Breast Pain, Nipple Discharge and Skin Changes. Cardiovascular Not Present- Chest Pain, Difficulty Breathing Lying Down, Leg Cramps, Palpitations, Rapid Heart Rate, Shortness of Breath and Swelling of Extremities. Gastrointestinal Not Present- Abdominal Pain, Bloating, Bloody Stool, Change in Bowel Habits, Chronic diarrhea, Constipation, Difficulty Swallowing, Excessive gas, Gets full quickly at meals, Hemorrhoids, Indigestion, Nausea, Rectal Pain and Vomiting. Male Genitourinary Not Present- Blood in Urine, Change in Urinary Stream, Frequency, Impotence, Nocturia, Painful Urination, Urgency and Urine Leakage. Musculoskeletal Not Present- Back Pain, Joint Pain, Joint Stiffness, Muscle Pain, Muscle Weakness and Swelling of Extremities. Neurological Not Present- Decreased Memory, Fainting, Headaches, Numbness, Seizures, Tingling, Tremor, Trouble walking and Weakness. Psychiatric Not Present- Anxiety, Bipolar, Change in Sleep Pattern, Depression, Fearful and Frequent crying. Endocrine Not Present- Cold Intolerance, Excessive Hunger, Hair Changes, Heat Intolerance, Hot flashes and New Diabetes. Hematology Not Present- Easy Bruising, Excessive bleeding, Gland problems, HIV and Persistent Infections.  Weight: 209.2 lb Height: 71in Body Surface Area: 2.18 m Body Mass Index: 29.18 kg/m      Physical ExamGeneral Note: HEENT normal Neck supple Chest clear Heart SR without murmurs Abdomen nontender Ext FROM without edema or clubbing Neuro alert and oriented x 3     Assessment & Plan  HIATAL HERNIA WITH GERD AND ESOPHAGITIS (K44.9)--plan lap Nissen  fundoplication  Matt B. Daphine DeutscherMartin, MD, [facility]ACS  [facility] [phone] beeper [phone]  10/14/2015 3:55 PM

## 2015-10-16 NOTE — Anesthesia Procedure Notes (Signed)
Procedure Name: Intubation Date/Time: 10/16/2015 7:34 AM Performed by: Donn PieriniUELLETTE, Coby [place] G Pre-anesthesia Checklist: Patient identified, Emergency Drugs available, Suction available, Patient being monitored and Timeout performed Patient Re-evaluated:Patient Re-evaluated prior to inductionOxygen Delivery Method: Circle system utilized and Simple face mask Preoxygenation: Pre-oxygenation with 100% oxygen Intubation Type: IV induction and Combination inhalational/ intravenous induction Ventilation: Mask ventilation without difficulty Laryngoscope Size: Mac, 4 and 3 Grade View: Grade II Tube type: Oral Tube size: 6.5 mm Number of attempts: 1 Airway Equipment and Method: Stylet Placement Confirmation: ETT inserted through vocal cords under direct vision,  positive ETCO2 and breath sounds checked- equal and bilateral Secured at: 23 cm Tube secured with: Tape

## 2015-10-16 NOTE — Anesthesia Postprocedure Evaluation (Signed)
Anesthesia Post Note  Patient: Ara Kussmaullan K Koranda  Procedure(s) Performed: Procedure(s) (LRB): LAPAROSCOPIC NISSEN FUNDOPLICATION & HIATAL HERNIA REPAIR, upper endoscopy (N/A)  Patient location during evaluation: PACU Anesthesia Type: General Level of consciousness: awake and alert Pain management: pain level controlled Vital Signs Assessment: post-procedure vital signs reviewed and stable Respiratory status: spontaneous breathing, nonlabored ventilation, respiratory function stable and patient connected to nasal cannula oxygen Cardiovascular status: blood pressure returned to baseline and stable Postop Assessment: no signs of nausea or vomiting Anesthetic complications: no    Last Vitals:  Filed Vitals:   10/16/15 1000 10/16/15 1015  BP: 138/87 157/85  Pulse: 99 93  Temp: 36.7 C   Resp: 16 19    Last Pain:  Filed Vitals:   10/16/15 1020  PainSc: 0-No pain                 Dalana Pfahler

## 2015-10-16 NOTE — Op Note (Signed)
Surgeon: Wenda LowMatt Alejandria Wessells, MD, FACS  Asst:  P.J. Carolynne Edouardoth, MD, FACS  Anes:  general  Procedure: Laparoscopic takedown of large type III mixed hiatal hernia with Nissen fundoplication over a #56 lighted bougie  Diagnosis: Type III hiatal hernia with GERD  Complications: none  EBL:   5 cc  Drains: none  Description of Procedure:  [facility]  After anesthesia was administered and the patient was prepped a timeout was performed.  Access to the abdomen was achieved with a 5 mm Optiview through the left upper quadrant without difficulty.  Six trocar sites were used including a 12 mm to the right of the midline.  A large diaphragmatic defect was seen and this was approached from the right side first taking down the gastrohepatic window and then dissecting the hernia out of the chest.  The dissection went across the midline.  I then took down short gastric and took this dissection up to the left crus.  A generous window was created posteriorly and a penrose was placed and used for retraction as the esophagus was freed .  The hernia fat pad was resected with the harmonic.  The diaphragm was repaired with 5 Endostitches of 0 Surgidek secured with Ty Knots.  A 56 lighted bougie was passed to the point of transition of the light.    The wrap was then brought around behind the esophagus with no tension and using contiguous portions of fundus,  the Nissen was performed with 3 sutures placed with the top one secured a Ty Knot and the lower two with free tied square knots.  The 5456 Bougie was removed.  The repair and wrap looked good.  The 12 was repaired with a  Single suture of O vicryl.  The skin was closed with 4-0 vicryl and Liquiban.    The patient tolerated the procedure well and was taken to the PACU in stable condition.     Matt B. Daphine DeutscherMartin, MD, [facility], [place] [phone]

## 2015-10-16 NOTE — Interval H&P Note (Signed)
History and Physical Interval Note:  10/16/2015 7:15 AM  Jose KussmaulAlan K Pineda  has presented today for surgery, with the diagnosis of HIATAL HERNIA  The various methods of treatment have been discussed with the patient and family. After consideration of risks, benefits and other options for treatment, the patient has consented to  Procedure(s): LAPAROSCOPIC NISSEN FUNDOPLICATION & HIATAL HERNIA REPAIR (N/A) as a surgical intervention .  The patient's history has been reviewed, patient examined, no change in status, stable for surgery.  I have reviewed the patient's chart and labs.  Questions were answered to the patient's satisfaction.     [name] B

## 2015-10-16 NOTE — Transfer of Care (Signed)
Immediate Anesthesia Transfer of Care Note  Patient: Jose Pineda  Procedure(s) Performed: Procedure(s): LAPAROSCOPIC NISSEN FUNDOPLICATION & HIATAL HERNIA REPAIR, upper endoscopy (N/A)  Patient Location: PACU  Anesthesia Type:General  Level of Consciousness: awake and oriented  Airway & Oxygen Therapy: Patient Spontanous Breathing and Patient connected to face mask oxygen  Post-op Assessment: Report given to RN and Post -op Vital signs reviewed and stable  Post vital signs: Reviewed and stable  Last Vitals:  Filed Vitals:   10/16/15 0534  BP: 139/85  Pulse: 73  Temp: 36.8 C  Resp: 18    Complications: No apparent anesthesia complications

## 2015-10-17 ENCOUNTER — Inpatient Hospital Stay (HOSPITAL_COMMUNITY): Payer: BC Managed Care – PPO

## 2015-10-17 LAB — BASIC METABOLIC PANEL
ANION GAP: 11 (ref 5–15)
BUN: 12 mg/dL (ref 6–20)
CO2: 27 mmol/L (ref 22–32)
Calcium: 8.8 mg/dL — ABNORMAL LOW (ref 8.9–10.3)
Chloride: 102 mmol/L (ref 101–111)
Creatinine, Ser: 0.93 mg/dL (ref 0.61–1.24)
GFR calc Af Amer: >60 mL/min (ref >60)
GFR calc non Af Amer: >60 mL/min (ref >60)
GLUCOSE: 100 mg/dL — AB (ref 65–99)
POTASSIUM: 3.9 mmol/L (ref 3.5–5.1)
Sodium: 140 mmol/L (ref 135–145)

## 2015-10-17 LAB — CBC
HEMATOCRIT: 39.5 % (ref 39.0–52.0)
HEMOGLOBIN: 13.5 g/dL (ref 13.0–17.0)
MCH: 30.8 pg (ref 26.0–34.0)
MCHC: 34.2 g/dL (ref 30.0–36.0)
MCV: 90.2 fL (ref 78.0–100.0)
Platelets: 227 10*3/uL (ref 150–400)
RBC: 4.38 MIL/uL (ref 4.22–5.81)
RDW: 13.3 % (ref 11.5–15.5)
WBC: 8.3 10*3/uL (ref 4.0–10.5)

## 2015-10-17 NOTE — Progress Notes (Signed)
1 Day Post-Op  Subjective: Complains of soreness. Otherwise ok. Study this am shows no leak and good position of wrap  Objective: Vital signs in last 24 hours: Temp:  [98.1 F (36.7 C)-98.9 F (37.2 C)] 98.5 F (36.9 C) (12/17 0555) Pulse Rate:  [67-89] 77 (12/17 0555) Resp:  [14-21] 14 (12/17 0555) BP: (113-157)/(59-96) 113/59 mmHg (12/17 0555) SpO2:  [95 %-100 %] 95 % (12/17 0555)    Intake/Output from previous day: 12/16 0701 - 12/17 0700 In: 2700 [I.V.:2700] Out: 1600 [Urine:1600] Intake/Output this shift:    Resp: clear to auscultation bilaterally Cardio: regular rate and rhythm GI: soft, appropriately tender  Lab Results:   Recent Labs  10/16/15 1346 10/17/15 0450  WBC 11.8* 8.3  HGB 13.7 13.5  HCT 40.3 39.5  PLT 233 227   BMET  Recent Labs  10/16/15 1346 10/17/15 0450  NA  --  140  K  --  3.9  CL  --  102  CO2  --  27  GLUCOSE  --  100*  BUN  --  12  CREATININE 0.91 0.93  CALCIUM  --  8.8*   PT/INR No results for input(s): LABPROT, INR in the last 72 hours. ABG No results for input(s): PHART, HCO3 in the last 72 hours.  Invalid input(s): PCO2, PO2  Studies/Results: No results found.  Anti-infectives: Anti-infectives    Start     Dose/Rate Route Frequency Ordered Stop   10/16/15 0534  cefoTEtan in Dextrose 5% (CEFOTAN) IVPB 2 g     2 g Intravenous On call to O.R. 10/16/15 0534 10/16/15 0734      Assessment/Plan: s/p Procedure(s): LAPAROSCOPIC NISSEN FUNDOPLICATION & HIATAL HERNIA REPAIR, upper endoscopy (N/A) Advance diet. Will start clears today Ambulate D/c foley  LOS: 1 day    TOTH III,PAUL S 10/17/2015

## 2015-10-18 NOTE — Progress Notes (Signed)
2 Days Post-Op  Subjective: No complaints  Objective: Vital signs in last 24 hours: Temp:  [98.1 F (36.7 C)-99.5 F (37.5 C)] 98.8 F (37.1 C) (12/18 0639) Pulse Rate:  [71-93] 93 (12/18 0639) Resp:  [14-16] 16 (12/18 0639) BP: (123-149)/(69-95) 142/95 mmHg (12/18 0639) SpO2:  [95 %-98 %] 98 % (12/18 0639)    Intake/Output from previous day: 12/17 0701 - 12/18 0700 In: 2000 [I.V.:2000] Out: 850 [Urine:850] Intake/Output this shift:    Resp: clear to auscultation bilaterally Cardio: regular rate and rhythm GI: soft, mild tenderness. incisions ok  Lab Results:   Recent Labs  10/16/15 1346 10/17/15 0450  WBC 11.8* 8.3  HGB 13.7 13.5  HCT 40.3 39.5  PLT 233 227   BMET  Recent Labs  10/16/15 1346 10/17/15 0450  NA  --  140  K  --  3.9  CL  --  102  CO2  --  27  GLUCOSE  --  100*  BUN  --  12  CREATININE 0.91 0.93  CALCIUM  --  8.8*   PT/INR No results for input(s): LABPROT, INR in the last 72 hours. ABG No results for input(s): PHART, HCO3 in the last 72 hours.  Invalid input(s): PCO2, PO2  Studies/Results: Dg Ugi W/water Sol Cm  10/17/2015  CLINICAL DATA:  Status post takedown a of hiatal hernia with Nissen fundoplication yesterday. EXAM: UPPER GI SERIES WITHOUT KUB TECHNIQUE: Routine upper GI series was performed with water-soluble contrast followed by thin barium. FLUOROSCOPY TIME:  Radiation Exposure Index (as provided by the fluoroscopic device): 37 mGy Fluoroscopy Time (in minutes and seconds):  1 minutes and 54 seconds COMPARISON:  08/21/2015 FINDINGS: Initial swallows of water soluble contrast in the upright position show normal emptying of the esophagus into the stomach with complete reduction of hiatal hernia and no evidence of contrast leak. The Nissen wrap is nonobstructive. Additional images were also performed with thin liquid barium with the patient nearly knees supine. Continued normal flow of barium is noted into the stomach with no evidence  of hiatal hernia or visible active gastroesophageal reflux. Visualized stomach and proximal duodenum are normal. IMPRESSION: Unremarkable post- operative upper GI series demonstrating no evidence of recurrent hiatal hernia or other complication after reduction of hiatal hernia and Nissen fundoplication. No leak or reflux is identified. The Nissen wrap is nonobstructive to liquid contrast material. Electronically Signed   By: Irish LackGlenn  Yamagata M.D.   On: 10/17/2015 11:00    Anti-infectives: Anti-infectives    Start     Dose/Rate Route Frequency Ordered Stop   10/16/15 0534  cefoTEtan in Dextrose 5% (CEFOTAN) IVPB 2 g     2 g Intravenous On call to O.R. 10/16/15 0534 10/16/15 0734      Assessment/Plan: s/p Procedure(s): LAPAROSCOPIC NISSEN FUNDOPLICATION & HIATAL HERNIA REPAIR, upper endoscopy (N/A) Advance diet  If he does well then he may be ready for discharge tomorrow ambulate  LOS: 2 days    TOTH III,Nioma Mccubbins S 10/18/2015

## 2015-10-19 MED ORDER — HYDROCODONE-ACETAMINOPHEN 7.5-325 MG/15ML PO SOLN: 15.0000 mL | ORAL | Status: DC | PRN

## 2015-10-19 MED ORDER — ONDANSETRON 4 MG PO TBDP: 4.0000 mg | ORAL_TABLET | Freq: Four times a day (QID) | ORAL | Status: DC | PRN

## 2015-10-19 NOTE — Care Management Note (Signed)
Case Management Note  Patient Details  Name: Jose Pineda MRN: [id number] Date of Birth: [birth year]  Subjective/Objective:    S/p Laparoscopic takedown of large type III mixed hiatal hernia with Nissen fundoplication                 Action/Plan: Discharge planning, no HH needs identified  Expected Discharge Date:                  Expected Discharge Plan:  Home/Self Care  In-House Referral:  NA  Discharge planning Services  CM Consult  Post Acute Care Choice:  NA Choice offered to:  NA  DME Arranged:  N/A DME Agency:  NA  HH Arranged:  NA HH Agency:  NA  Status of Service:  Completed, signed off  Medicare Important Message Given:    Date Medicare IM Given:    Medicare IM give by:    Date Additional Medicare IM Given:    Additional Medicare Important Message give by:     If discussed at Long Length of Stay Meetings, dates discussed:    Additional Comments:  Alexis Goodelleele, Emilyanne Mcgough K, RN 10/19/2015, 9:53 AM

## 2015-10-19 NOTE — Discharge Instructions (Signed)
Laparoscopic Nissen Fundoplication °Laparoscopic Nissen fundoplication is surgery to relieve heartburn and other problems caused by gastric fluids flowing up into your esophagus. The esophagus is the tube that carries food and liquid from your throat to your stomach. Normally, the muscle that sits between your stomach and your esophagus (lower esophageal sphincter or LES) keeps stomach fluids in your stomach. °In some people, the LES does not work properly, and stomach fluids flow up into the esophagus. This can happen when part of the stomach bulges through the LES (hiatal hernia). The backward flow of stomach fluids can cause a type of severe and long-standing heartburn that is called gastroesophageal reflux disease (GERD). You may need this surgery if other treatments for GERD have not helped. °In a laparoscopic Nissen fundoplication, the upper part of your stomach is wrapped around the lower part of your esophagus to strengthen the LES and prevent reflux. If you have a hiatal hernia, it will also be repaired with this surgery. The procedure is done through several small incisions in your abdomen. It is performed using a thin, telescopic instrument (laparoscope) and other instruments that can pass through the scope or through other small incisions. °LET YOUR HEALTH CARE PROVIDER KNOW ABOUT: °· Any allergies you have. °· All medicines you are taking, including vitamins, herbs, eye drops, creams, and over-the-counter medicines. °· Previous problems you or members of your family have had with the use of anesthetics. °· Any blood disorders you have. °· Previous surgeries you have had. °· Medical conditions you have. °RISKS AND COMPLICATIONS °Generally, this is a safe procedure. However, problems may occur, including: °· Difficulty swallowing (dysphagia). °· Bloating. °· Nausea or vomiting. °· Damage to the lung, causing a collapsed lung. °· Infection or bleeding. °BEFORE THE PROCEDURE °· Ask your health care provider  about: °¨ Changing or stopping your regular medicines. This is especially important if you are taking diabetes medicines or blood thinners. °¨ Taking medicines such as aspirin and ibuprofen. These medicines can thin your blood. Do not take these medicines before your procedure if your health care provider asks you not to. °· Follow your health care provider's instructions about eating or drinking restrictions. °· Plan to have someone take you home after the procedure. °PROCEDURE °· An IV tube will be inserted into one of your veins. It will be used to give you fluids and medicines during the procedure. °· You will be given a medicine that makes you fall asleep (general anesthetic). °· Your abdomen will be cleaned with a germ-killing solution (antiseptic). °· The surgeon will make a small incision in your abdomen and insert a tube through the incision. °· Your abdomen will be filled with a gas. This helps the surgeon to see your organs more easily and it makes more space to work. °· The surgeon will insert the laparoscope through the incision. The scope has a camera that will send pictures to a monitor in the operating room. °· The surgeon will make several other small incisions in your abdomen to insert the other instruments that are needed during the procedure. °· Another instrument (dilator) will be passed through your mouth and down your esophagus into the upper part of your stomach. The dilator will prevent your LES from being closed too tightly during surgery. °· The surgeon will pass the top portion of your stomach behind the lower part of your esophagus and wrap it all the way around. This will be stitched into place. °· If you have a hiatal hernia,   it will be repaired during this procedure. °· All instruments will be removed, and your incisions will be closed under your skin with stitches (sutures). Skin adhesive strips may also be used. °· A bandage (dressing) will be placed on your skin over the  incisions. °The procedure may vary among health care providers and hospitals. °AFTER THE PROCEDURE °· You will be moved to a recovery area. °· Your blood pressure, heart rate, breathing rate, and blood oxygen level will be monitored often until the medicines you were given have worn off. °· You will be given pain medicine as needed. °· Your IV tube will be kept in until you are able to drink fluids. °  °This information is not intended to replace advice given to you by your health care provider. Make sure you discuss any questions you have with your health care provider. °  °Document Released: 11/07/2014 Document Reviewed: 11/07/2014 °Elsevier Interactive Patient Education ©2016 [employer] Inc[employer] ° °

## 2015-10-19 NOTE — Discharge Summary (Signed)
Physician Discharge Summary  Patient ID: Jose Pineda MRN: [id number] DOB/AGE: [birth year] 44 y.o.  Admit date: 10/16/2015 Discharge date: 10/19/2015  Admission Diagnoses:  GERD and hiatal hernia  Discharge Diagnoses:  same  Active Problems:   Status post Nissen fundoplication Dec 2016   Surgery:  Repair of moderately large type III hiatal hernia and Nissen fundoplication over a 56 bougie  Discharged Condition: improved  Hospital Course:   Had surgery.  Swallow on pD 1 looked good.  Ready for discharge on PD 3  Consults: none  Significant Diagnostic Studies: UGI    Discharge Exam: Blood pressure 128/77, pulse 63, temperature 98.3 F (36.8 C), temperature source Oral, resp. rate 16, height 5\' 11"  (1.803 m), weight 97.07 kg (214 lb), SpO2 97 %. Incisions OK  Disposition: 01-Home or Self Care  Discharge Instructions    Discharge instructions    Complete by:  As directed   Full liquid diet for the balance of the week and then pureed diet for 3-4 weeks.  Then chew your food well.     Increase activity slowly    Complete by:  As directed             Medication List    TAKE these medications        fluticasone 50 MCG/ACT nasal spray  Commonly known as:  FLONASE  Place 2 sprays into both nostrils daily as needed for allergies or rhinitis.     HYDROcodone-acetaminophen 7.5-325 mg/15 ml solution  Commonly known as:  HYCET  Take 15 mLs by mouth every 4 (four) hours as needed for moderate pain.     ibuprofen 200 MG tablet  Commonly known as:  ADVIL,MOTRIN  Take 400 mg by mouth every 6 (six) hours as needed (back pain).     multivitamin with minerals Tabs tablet  Take 1 tablet by mouth daily.     ondansetron 4 MG disintegrating tablet  Commonly known as:  ZOFRAN-ODT  Take 1 tablet (4 mg total) by mouth every 6 (six) hours as needed for nausea.     OVER THE COUNTER MEDICATION  [employer] Non Drowsy Daytime Multi Symptom Cold /Flu Relief - as needed     pantoprazole 40  MG tablet  Commonly known as:  PROTONIX  TAKE 1 TABLET BY MOUTH ONCE A DAY 30 MINUTES PRIOR TO A MEAL     VITAMIN C PO  Take 1 tablet by mouth daily.     ZYRTEC ALLERGY 10 MG tablet  Generic drug:  cetirizine  Take 10 mg by mouth daily as needed for allergies.           Follow-up Information    Follow up with Luretha MurphyMARTIN,Matan Steen B, MD. Schedule an appointment as soon as possible for a visit in 3 weeks.   Specialty:  General Surgery   Contact information:   [address] [place] [address] [phone]       Signed: Valarie MerinoMARTIN,Kathyjo Briere B 10/19/2015, 10:03 AM

## 2016-05-10 ENCOUNTER — Encounter: Payer: Self-pay | Admitting: Family Medicine

## 2016-05-10 ENCOUNTER — Ambulatory Visit (INDEPENDENT_AMBULATORY_CARE_PROVIDER_SITE_OTHER): Payer: BC Managed Care – PPO | Admitting: Family Medicine

## 2016-05-10 VITALS — BP 122/80 | HR 68 | Temp 97.9°F | Ht 71.0 in | Wt 195.5 lb

## 2016-05-10 DIAGNOSIS — M7660 Achilles tendinitis, unspecified leg: Secondary | ICD-10-CM | POA: Diagnosis not present

## 2016-05-10 DIAGNOSIS — Z13220 Encounter for screening for lipoid disorders: Secondary | ICD-10-CM

## 2016-05-10 DIAGNOSIS — Z8249 Family history of ischemic heart disease and other diseases of the circulatory system: Secondary | ICD-10-CM

## 2016-05-10 DIAGNOSIS — Z0000 Encounter for general adult medical examination without abnormal findings: Secondary | ICD-10-CM | POA: Diagnosis not present

## 2016-05-10 DIAGNOSIS — R5383 Other fatigue: Secondary | ICD-10-CM

## 2016-05-10 DIAGNOSIS — Z7189 Other specified counseling: Secondary | ICD-10-CM

## 2016-05-10 NOTE — Patient Instructions (Signed)
Let me check a few sources before ordering labs.   We'll be in touch.  When you do have a lab visit, then come in fasting.  Take care.  Glad to see you.

## 2016-05-10 NOTE — Progress Notes (Signed)
Pre visit review using our clinic review tool, if applicable. No additional management support is needed unless otherwise documented below in the visit note.  New patient to me.    CPE- See plan.  Routine anticipatory guidance given to patient.  See health maintenance. Tetanus done ~2012 per patient report.  Flu encouraged.  PNA and shingles not due Living will d/w pt.  Wife designated if patient were incapacitated.   Diet and exercise d/w pt, doing well on both.  See following re: labs, to be done later on Colon and prostate cancer screening not due.  [facility].    FH DVT/PE.  Mother on anticoagulation after PE.  Mult other blood relatives died/prev had DVT or PE.  No personal hx of clot.  No sibs with h/o DVT/PE.  No other known bleeding disorder.  No other family member with prev hypercoag w/u known.    Fatigue.  Some napping.  Postprandial fatigue.  occ shakes, thought he had lower sugar causing sx, ie when fasting.  No recent labs.    PMH and SH reviewed  Meds, vitals, and allergies reviewed.   ROS: Per HPI.  Unless specifically indicated otherwise in HPI, the patient denies:  General: fever. Eyes: acute vision changes ENT: sore throat Cardiovascular: chest pain Respiratory: SOB GI: vomiting GU: dysuria Musculoskeletal: acute back pain Derm: acute rash Neuro: acute motor dysfunction Psych: worsening mood Endocrine: polydipsia Heme: bleeding Allergy: hayfever  GEN: nad, alert and oriented HEENT: mucous membranes moist NECK: supple w/o LA CV: rrr. PULM: ctab, no inc wob ABD: soft, +bs EXT: no edema SKIN: no acute rash

## 2016-05-11 ENCOUNTER — Encounter: Payer: Self-pay | Admitting: Family Medicine

## 2016-05-11 DIAGNOSIS — R5383 Other fatigue: Secondary | ICD-10-CM | POA: Insufficient documentation

## 2016-05-11 DIAGNOSIS — Z0000 Encounter for general adult medical examination without abnormal findings: Secondary | ICD-10-CM | POA: Insufficient documentation

## 2016-05-11 DIAGNOSIS — M7660 Achilles tendinitis, unspecified leg: Secondary | ICD-10-CM | POA: Insufficient documentation

## 2016-05-11 DIAGNOSIS — Z7189 Other specified counseling: Secondary | ICD-10-CM | POA: Insufficient documentation

## 2016-05-11 DIAGNOSIS — Z8249 Family history of ischemic heart disease and other diseases of the circulatory system: Secondary | ICD-10-CM | POA: Insufficient documentation

## 2016-05-11 NOTE — Assessment & Plan Note (Signed)
Tetanus done ~2012 per patient report.  Flu encouraged.  PNA and shingles not due Living will d/w pt.  Wife designated if patient were incapacitated.   Diet and exercise d/w pt, doing well on both.  See following re: labs, to be done later on Colon and prostate cancer screening not due.  [facility].

## 2016-05-11 NOTE — Assessment & Plan Note (Signed)
Reasonable to check routine labs.

## 2016-05-11 NOTE — Assessment & Plan Note (Signed)
Needs testing.  Will arrange.

## 2016-05-12 ENCOUNTER — Telehealth: Payer: Self-pay | Admitting: *Deleted

## 2016-05-12 NOTE — Telephone Encounter (Signed)
Left detailed message on voicemail asking patient to call in for fasting lab appt.

## 2016-05-12 NOTE — Telephone Encounter (Signed)
-----   Message from Joaquim NamGraham S Duncan, MD sent at 05/11/2016 11:12 AM EDT ----- Call pt.  Needs labs, needs hypercoag panel done.  I put all the orders in.  Fasting lab visit when possible for patient.   Thanks.

## 2016-05-13 ENCOUNTER — Other Ambulatory Visit (INDEPENDENT_AMBULATORY_CARE_PROVIDER_SITE_OTHER): Payer: BC Managed Care – PPO

## 2016-05-13 DIAGNOSIS — Z13220 Encounter for screening for lipoid disorders: Secondary | ICD-10-CM | POA: Diagnosis not present

## 2016-05-13 DIAGNOSIS — R5383 Other fatigue: Secondary | ICD-10-CM

## 2016-05-13 DIAGNOSIS — Z8249 Family history of ischemic heart disease and other diseases of the circulatory system: Secondary | ICD-10-CM

## 2016-05-13 LAB — COMPREHENSIVE METABOLIC PANEL
ALT: 18 U/L (ref 0–53)
AST: 17 U/L (ref 0–37)
Albumin: 4.2 g/dL (ref 3.5–5.2)
Alkaline Phosphatase: 65 U/L (ref 39–117)
BILIRUBIN TOTAL: 0.7 mg/dL (ref 0.2–1.2)
BUN: 14 mg/dL (ref 6–23)
CHLORIDE: 105 meq/L (ref 96–112)
CO2: 28 meq/L (ref 19–32)
Calcium: 9.3 mg/dL (ref 8.4–10.5)
Creatinine, Ser: 0.88 mg/dL (ref 0.40–1.50)
GFR: 99.78 mL/min (ref >60.00)
GLUCOSE: 92 mg/dL (ref 70–99)
Potassium: 4.2 mEq/L (ref 3.5–5.1)
SODIUM: 140 meq/L (ref 135–145)
Total Protein: 7.2 g/dL (ref 6.0–8.3)

## 2016-05-13 LAB — CBC WITH DIFFERENTIAL/PLATELET
BASOS ABS: 0 10*3/uL (ref 0.0–0.1)
Basophils Relative: 0.4 % (ref 0.0–3.0)
EOS ABS: 0.1 10*3/uL (ref 0.0–0.7)
Eosinophils Relative: 1.6 % (ref 0.0–5.0)
HEMATOCRIT: 42.4 % (ref 39.0–52.0)
Hemoglobin: 14.3 g/dL (ref 13.0–17.0)
LYMPHS ABS: 2 10*3/uL (ref 0.7–4.0)
LYMPHS PCT: 39.7 % (ref 12.0–46.0)
MCHC: 33.6 g/dL (ref 30.0–36.0)
MCV: 89.6 fl (ref 78.0–100.0)
MONO ABS: 0.6 10*3/uL (ref 0.1–1.0)
Monocytes Relative: 11.7 % (ref 3.0–12.0)
NEUTROS ABS: 2.3 10*3/uL (ref 1.4–7.7)
NEUTROS PCT: 46.6 % (ref 43.0–77.0)
PLATELETS: 249 10*3/uL (ref 150.0–400.0)
RBC: 4.74 Mil/uL (ref 4.22–5.81)
RDW: 13.1 % (ref 11.5–15.5)
WBC: 4.9 10*3/uL (ref 4.0–10.5)

## 2016-05-13 LAB — LIPID PANEL
CHOL/HDL RATIO: 2
Cholesterol: 165 mg/dL (ref 0–200)
HDL: 69.3 mg/dL (ref >39.00)
LDL CALC: 87 mg/dL (ref 0–99)
NONHDL: 95.52
Triglycerides: 45 mg/dL (ref 0.0–149.0)
VLDL: 9 mg/dL (ref 0.0–40.0)

## 2016-05-13 LAB — TSH: TSH: 0.62 u[IU]/mL (ref 0.35–4.50)

## 2016-05-18 LAB — HYPERCOAGULABLE PANEL, COMPREHENSIVE
AntiThromb III Func: 116 % activity (ref 80–120)
Anticardiolipin IgM: <12 [MPL'U]
Beta-2 Glyco I IgG: <9 SGU (ref <=20)
Beta-2-Glycoprotein I IgM: <9 SMU (ref <=20)
PROTEIN C ANTIGEN: 103 % (ref 70–140)
PROTEIN S ACTIVITY: 96 % (ref 70–150)
PROTEIN S ANTIGEN, TOTAL: 103 % (ref 70–140)
Protein C Activity: 90 % (ref 70–180)

## 2016-05-18 LAB — RFX DRVVT SCR W/RFLX CONF 1:1 MIX: DRVVT SCREEN: 31 s (ref <=45)

## 2016-05-18 LAB — RFX PTT-LA W/RFX TO HEX PHASE CONF: PTT-LA Screen: 37 s (ref <=40)

## 2016-08-22 ENCOUNTER — Encounter: Payer: Self-pay | Admitting: Family Medicine

## 2016-08-22 ENCOUNTER — Ambulatory Visit (INDEPENDENT_AMBULATORY_CARE_PROVIDER_SITE_OTHER): Payer: BC Managed Care – PPO | Admitting: Family Medicine

## 2016-08-22 VITALS — BP 122/74 | HR 68 | Temp 98.9°F | Wt 192.5 lb

## 2016-08-22 DIAGNOSIS — R1030 Lower abdominal pain, unspecified: Secondary | ICD-10-CM | POA: Diagnosis not present

## 2016-08-22 DIAGNOSIS — Z23 Encounter for immunization: Secondary | ICD-10-CM

## 2016-08-22 DIAGNOSIS — R109 Unspecified abdominal pain: Secondary | ICD-10-CM | POA: Diagnosis not present

## 2016-08-22 NOTE — Patient Instructions (Signed)
I think I feel a hernia.  It would be reasonable to talk to general surgery.   Shirlee LimerickMarion will call about your referral. Take care.  Glad to see you.

## 2016-08-22 NOTE — Progress Notes (Signed)
Pre visit review using our clinic review tool, if applicable. No additional management support is needed unless otherwise documented below in the visit note. 

## 2016-08-22 NOTE — Progress Notes (Signed)
Abd pain, on the R side. Going on for a few weeks.  H/o hiatal hernia surgery last year.  It got to the point that he wanted to get checked.  Tender to palpation, when pain is present, but not always painful.  Can have pain in the groin on the R side.  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  No FCNAVD.  No rash.  He doesn't feel unwell overall.  No blood in stool.  Has been on strict diet since his prev surgery.  No dysuria.  No L sided abd pain.  No scrotal masses noted by patient.   Meds, vitals, and allergies reviewed.   ROS: Per HPI unless specifically indicated in ROS section    No apparent distress Heart is regular Lungs clear Abdomen soft nontender Bilateral groin exam with possible small inguinal hernias noted bilaterally. I think I can feel a small bulging mass when he coughs, on either side. He didn't have much tenderness on the right side, but he had some tenderness on the left with testing. Scrotal exam is grossly normal.

## 2016-08-23 DIAGNOSIS — R1030 Lower abdominal pain, unspecified: Secondary | ICD-10-CM | POA: Insufficient documentation

## 2016-08-23 NOTE — Assessment & Plan Note (Signed)
The patient still could have symptoms from abdominal wall strain. It could be he has a small hernia contributing to his symptoms. I do think I feel a hernia, a small one, bilaterally on exam. Anatomy discussed with patient. I think it is reasonable to have him go talk to general surgery. He is a Runner, broadcasting/film/videoteacher and if he is going to have it repaired it would be nice to get this done during his Christmas break. I would appreciate surgery input. All questions were answered, to the best of my ability.

## 2016-08-29 ENCOUNTER — Telehealth: Payer: BC Managed Care – PPO | Admitting: Family

## 2016-08-29 DIAGNOSIS — B9689 Other specified bacterial agents as the cause of diseases classified elsewhere: Secondary | ICD-10-CM

## 2016-08-29 DIAGNOSIS — J329 Chronic sinusitis, unspecified: Secondary | ICD-10-CM

## 2016-08-29 MED ORDER — AMOXICILLIN-POT CLAVULANATE 875-125 MG PO TABS: 1.0000 | ORAL_TABLET | Freq: Two times a day (BID) | ORAL | 0 refills | Status: AC

## 2016-08-29 NOTE — Progress Notes (Signed)
We are sorry that you are not feeling well.  Here is how we plan to help!  Based on what you have shared with me it looks like you have sinusitis.  Sinusitis is inflammation and infection in the sinus cavities of the head.  Based on your presentation I believe you most likely have Acute Bacterial Sinusitis.  This is an infection caused by bacteria and is treated with antibiotics. I have prescribed Augmentin 875mg /125mg  one tablet twice daily with food, for 7 days. You may use an oral decongestant such as Mucinex D or if you have glaucoma or high blood pressure use plain Mucinex. Saline nasal spray help and can safely be used as often as needed for congestion.  If you develop worsening sinus pain, fever or notice severe headache and vision changes, or if symptoms are not better after completion of antibiotic, please schedule an appointment with a health care provider.  I   Sinus infections are not as easily transmitted as other respiratory infection, however we still recommend that you avoid close contact with loved ones, especially the very young and elderly.  Remember to wash your hands thoroughly throughout the day as this is the number one way to prevent the spread of infection!  Home Care:  Only take medications as instructed by your medical team.  Complete the entire course of an antibiotic.  Do not take these medications with alcohol.  A steam or ultrasonic humidifier can help congestion.  You can place a towel over your head and breathe in the steam from hot water coming from a faucet.  Avoid close contacts especially the very young and the elderly.  Cover your mouth when you cough or sneeze.  Always remember to wash your hands.  Get Help Right Away If:  You develop worsening fever or sinus pain.  You develop a severe head ache or visual changes.  Your symptoms persist after you have completed your treatment plan.  Make sure you  Understand these instructions.  Will watch  your condition.  Will get help right away if you are not doing well or get worse.  Your e-visit answers were reviewed by a board certified advanced clinical practitioner to complete your personal care plan.  Depending on the condition, your plan could have included both over the counter or prescription medications.  If there is a problem please reply  once you have received a response from your provider.  Your safety is important to us.  If you have drug allergies check your prescription carefully.    You can use MyChart to ask questions about today's visit, request a non-urgent call back, or ask for a work or school excuse for 24 hours related to this e-Visit. If it has been greater than 24 hours you will need to follow up with your provider, or enter a new e-Visit to address those concerns.  You will get an e-mail in the next two days asking about your experience.  I hope that your e-visit has been valuable and will speed your recovery. Thank you for using e-visits.

## 2016-11-11 ENCOUNTER — Ambulatory Visit (INDEPENDENT_AMBULATORY_CARE_PROVIDER_SITE_OTHER): Payer: BC Managed Care – PPO | Admitting: Internal Medicine

## 2016-11-11 ENCOUNTER — Encounter: Payer: BC Managed Care – PPO | Admitting: Internal Medicine

## 2016-11-11 DIAGNOSIS — Z7189 Other specified counseling: Secondary | ICD-10-CM

## 2016-11-11 DIAGNOSIS — Z789 Other specified health status: Secondary | ICD-10-CM | POA: Diagnosis not present

## 2016-11-11 DIAGNOSIS — Z9189 Other specified personal risk factors, not elsewhere classified: Secondary | ICD-10-CM

## 2016-11-11 DIAGNOSIS — Z7184 Encounter for health counseling related to travel: Secondary | ICD-10-CM

## 2016-11-11 DIAGNOSIS — Z23 Encounter for immunization: Secondary | ICD-10-CM

## 2016-11-11 MED ORDER — AZITHROMYCIN 500 MG PO TABS: 1000.0000 mg | ORAL_TABLET | Freq: Once | ORAL | 0 refills | Status: AC

## 2016-11-11 MED ORDER — ATOVAQUONE-PROGUANIL HCL 250-100 MG PO TABS: 1.0000 | ORAL_TABLET | Freq: Every day | ORAL | 0 refills | Status: DC

## 2016-11-11 MED ORDER — TYPHOID VACCINE PO CPDR: 1.0000 | DELAYED_RELEASE_CAPSULE | ORAL | 0 refills | Status: DC

## 2016-11-11 NOTE — Patient Instructions (Signed)
[facility]                [address]                   [place], [place] [id number]                      Phone: [phone]                        Fax: [phone]   Planned departure date: February           Planned return date: 10 days Countries of travel: [place]    Guidelines for the Prevention & Treatment of Traveler's Diarrhea  Prevention: "Boil it, Peel it, Adriana SimasCook it, or Forget it"   the fewer chances -> lower risk: try to stick to food & water precautions as much as possible"   If it's "piping hot"; it is probably okay, if not, it may not be   Treatment   1) You should always take care to drink lots of fluids in order to avoid dehydration   2) You should bring medications with you in case you come down with a case of diarrhea   3) OTC = bring pepto-bismol - can take with initial abdominal symptoms;                    Imodium - can help slow down your intestinal tract, can help relief cramps                    and diarrhea, can take if no bloody diarrhea  Use azithromycin if needed for traveler's diarrhea  Guidelines for the Prevention of Malaria  Avoidance:  -fewer mosquito bites = lower risk. Mosquitos can bite at night as well as daytime  -cover up (long sleeve clothing), mosquito nets, screens  -Insect repellent for your skin ( DEET containing lotion > 20%): for clothes ( permethrin spray)   2 days prior to travel, start malarone, daily dose starting 1-2 days before entering endemic area, ending 7 days after leaving area for malaria prevention.   Immunizations received today: Hepatitis A series  Future immunizations, if indicated Hepatitis A series in 6-12 months for #2   Prior to travel:  1) Be sure to pick up appropriate prescriptions, including medicine you take daily. Do not expect to be able to fill your prescriptions abroad.  2) Strongly consider obtaining traveler's insurance, including emergency  evacuation insurance. Most plans in the [place] do not cover participants abroad. (see below for resources)  3) Register at the appropriate [place] embassy or consulate with travel dates so they are aware of your presence in-country and for helpful advice during travel using the [employer] Traveler Enrollment Program (STEP, GuyGalaxy.sihttps://step.state.gov/step).  4) Leave contact information with a relative or friend.  5) Keep a Corporate treasurerphotocopy passport, credit cards in case they become lost or stolen  6) Inform your credit card company that you will be travelling abroad   During travel:  1) If you become ill and need medical advice, the U.S. WellPointembassy website of the country you are traveling in general provides a list of English speaking doctors.  We are also available on MyChart for remote consultation if you register prior to travel. 2) Avoid motorcycles or scooters when at all possible. Traffic laws in many countries are lax and  accidents occur frequently.  3) Do not take any unnecessary risks that you wouldn't do at home.   Resources:  -Country specific information: BlindResource.ca or GreenNylon.com.cy  -Press photographer (DEET, mosquito nets): REI, [employer] Goods store, [employer], Chunky insurance options: gatewayplans.com; http://clayton-rivera.info/; travelguard.com or Good Pilgrim's Pride, gninsurance.com or info@gninsurance .com, H1235423.   Post Travel:  If you return from your trip ill, call your primary care doctor or our travel clinic @ [phone].   Enjoy your trip and know that with proper pre-travel preparation, most people have an enjoyable and uninterrupted trip!

## 2016-11-12 NOTE — Progress Notes (Signed)
Subjective:   Jose Pineda is a 46 y.o. male who presents to the Infectious Disease clinic for travel consultation. Planned departure date: February 2018          Planned return date: 10 days Countries of travel: [place]  Areas in country: urban   Accommodations: hotel Purpose of travel: vacation Prior travel out of [place]: yes     Objective:   Medications: reviewed    Assessment:   No contraindications to travel. none     Plan:    Issues discussed: environmental concerns, future shots, insect-borne illnesses, Japanese encephalitis, malaria, motion sickness, MVA safety, rabies, safe food/water, traveler's diarrhea, website/handouts for more information, what to do if ill upon return and what to do if ill while there. Immunizations recommended: Hepatitis A series and Typhoid (oral). Malaria prophylaxis: malarone, daily dose starting 1-2 days before entering endemic area, ending 7 days after leaving area Traveler's diarrhea prophylaxis: azithromycin. Total duration of visit: 1 Hour. Total time spent on education, counseling, coordination of care: 30 Minutes.

## 2016-11-18 ENCOUNTER — Encounter: Payer: BC Managed Care – PPO | Admitting: Internal Medicine

## 2017-02-10 ENCOUNTER — Other Ambulatory Visit: Payer: Self-pay | Admitting: General Surgery

## 2017-02-10 DIAGNOSIS — R109 Unspecified abdominal pain: Secondary | ICD-10-CM

## 2017-02-13 ENCOUNTER — Ambulatory Visit (INDEPENDENT_AMBULATORY_CARE_PROVIDER_SITE_OTHER): Payer: BC Managed Care – PPO | Admitting: Family Medicine

## 2017-02-13 ENCOUNTER — Encounter: Payer: Self-pay | Admitting: Family Medicine

## 2017-02-13 ENCOUNTER — Ambulatory Visit
Admission: RE | Admit: 2017-02-13 | Discharge: 2017-02-13 | Disposition: A | Discharge status: Home / Self Care | Payer: BC Managed Care – PPO | Source: Ambulatory Visit | Attending: General Surgery | Admitting: General Surgery

## 2017-02-13 VITALS — BP 128/84 | HR 73 | Temp 98.0°F | Ht 70.5 in | Wt 196.8 lb

## 2017-02-13 DIAGNOSIS — Z029 Encounter for administrative examinations, unspecified: Secondary | ICD-10-CM | POA: Diagnosis not present

## 2017-02-13 DIAGNOSIS — R109 Unspecified abdominal pain: Secondary | ICD-10-CM

## 2017-02-13 DIAGNOSIS — Z111 Encounter for screening for respiratory tuberculosis: Secondary | ICD-10-CM | POA: Diagnosis not present

## 2017-02-13 MED ORDER — IOPAMIDOL (ISOVUE-300) INJECTION 61%
100.0000 mL | Freq: Once | INTRAVENOUS | Status: AC | PRN
  Administered 2017-02-13: 100 mL via INTRAVENOUS

## 2017-02-13 NOTE — Progress Notes (Signed)
Pre visit review using our clinic review tool, if applicable. No additional management support is needed unless otherwise documented below in the visit note. 

## 2017-02-13 NOTE — Patient Instructions (Signed)
Recheck in 2 days- RN visit for PPD reading.  You may need labs at that point (VZV, MMR, HBV) if you have trouble getting other documentation.  I'll check on depth perception testing on your return.  Take care.  Glad to see you.

## 2017-02-13 NOTE — Progress Notes (Signed)
He is moving to [place] in 6-12 weeks.  He is going to be in [place], at an [employer].  He will be there for an undetermined period, likely 2-3 years.  He is going to work for [employer] on the [place].    He has h/o upper abd discomfort and has f/u CT pending today.  No FCNAVD.  He feels well o/w.   See scanned forms. PPD placed today. He will check old records regarding chickenpox, MMR, HBV status. We can draw titers on these if needed. Discussed with patient.  He has no acute complaints o/w.   PMH and SH reviewed  ROS: Per HPI unless specifically indicated in ROS section   Meds, vitals, and allergies reviewed.   GEN: nad, alert and oriented HEENT: mucous membranes moist NECK: supple w/o LA CV: rrr. PULM: ctab, no inc wob ABD: soft, +bs EXT: no edema SKIN: no acute rash See scanned forms.

## 2017-02-14 DIAGNOSIS — Z029 Encounter for administrative examinations, unspecified: Secondary | ICD-10-CM | POA: Insufficient documentation

## 2017-02-14 NOTE — Assessment & Plan Note (Signed)
I don't see any issues with his health that would prevent employment. See notes on form. All questions answered. Return for PPD and potentially for immunization titers if needed. He agrees with plan.  He has CT abdomen pelvis arranged per outside clinic and I will await recommendations from ordering M.D. regarding that.  >25 minutes spent in face to face time with patient, >50% spent in counselling or coordination of care.

## 2017-02-15 ENCOUNTER — Telehealth: Payer: Self-pay

## 2017-02-15 ENCOUNTER — Ambulatory Visit: Payer: BC Managed Care – PPO

## 2017-02-15 LAB — TB SKIN TEST
INDURATION: 0 mm
TB Skin Test: NEGATIVE

## 2017-02-15 NOTE — Telephone Encounter (Signed)
Noted  

## 2017-02-15 NOTE — Telephone Encounter (Signed)
Please pull me when he comes in.  I have papers that I need to give him and sign when his PPD is read.

## 2017-02-15 NOTE — Telephone Encounter (Signed)
Patient called to update you on his vaccines. He said he had 3 of the HBV MMR done in 2010 and was positive for the antibodies.  VZV done in 2010 and positive for antibodies.

## 2017-04-17 LAB — TSH: TSH: 0.76 (ref 0.41–5.90)

## 2017-04-17 LAB — PSA: PSA: 6.7

## 2017-04-17 LAB — CBC AND DIFFERENTIAL: HEMOGLOBIN: 14.6 (ref 13.5–17.5)

## 2017-04-18 ENCOUNTER — Encounter: Payer: Self-pay | Admitting: Family Medicine

## 2017-04-19 ENCOUNTER — Ambulatory Visit (INDEPENDENT_AMBULATORY_CARE_PROVIDER_SITE_OTHER): Payer: BC Managed Care – PPO | Admitting: Family Medicine

## 2017-04-19 ENCOUNTER — Encounter: Payer: Self-pay | Admitting: Family Medicine

## 2017-04-19 ENCOUNTER — Other Ambulatory Visit: Payer: Self-pay | Admitting: Family Medicine

## 2017-04-19 DIAGNOSIS — R972 Elevated prostate specific antigen [PSA]: Secondary | ICD-10-CM

## 2017-04-19 NOTE — Patient Instructions (Signed)
Ask about seeing a urology clinic and update me as needed.  Take care.  Glad to see you.

## 2017-04-19 NOTE — Progress Notes (Signed)
PSA elevation, d/w pt.  appears to treated with Clomid. PSA was initially normal but then trended up. He has been off clomid about 1 month.  PSA is slightly lower on last check but still >6.  No LUTS.  D/w pt.  all this PSA monitoring and Clomid treatment was done through an outside clinic.  Meds, vitals, and allergies reviewed.   ROS: Per HPI unless specifically indicated in ROS section   nad Remainder of exam deferred as it would not change management.

## 2017-04-20 DIAGNOSIS — R972 Elevated prostate specific antigen [PSA]: Secondary | ICD-10-CM | POA: Insufficient documentation

## 2017-04-20 NOTE — Assessment & Plan Note (Addendum)
My hope is that all of this is related to Clomid use and that he should gradually have a decreasing PSA level now that he is off the medication. Discussed with patient. At this point he does need urology evaluation. Rectal exam today would not change the fact that he still going to need urology evaluation, so this was deferred. Rationale discussed with patient. Pathophysiology of PSA elevation and Clomid use discussed with patient. He is getting ready to travel to [place] for an extended stay with work. It is not possible to get him set up with urology before he leaves, I have checked on that in the meantime. He's going to need to seek evaluation there. Discussed with patient. He has a copy of his labs, he can contact us electronically, and I wrote a brief letter describing his situation. He will update me as needed. All questions answered. >15 minutes spent in face to face time with patient, >50% spent in counselling or coordination of care

## 2017-04-25 ENCOUNTER — Encounter: Payer: Self-pay | Admitting: Family Medicine

## 2019-04-18 ENCOUNTER — Telehealth: Payer: Self-pay

## 2019-04-18 NOTE — Telephone Encounter (Signed)
Pt left v/m that he has just returned from living in [place] for two years and wants to know should he be tested for covid? Pt is self quaranting for 14 days but pt was interested in covid testing. Pt has no covid symptoms and no known exposure to + covid. Pt scheduled virtual appt on 04/19/19 at 11:30.

## 2019-04-18 NOTE — Telephone Encounter (Signed)
Will see then. Thanks. 

## 2019-04-19 ENCOUNTER — Other Ambulatory Visit: Payer: Self-pay

## 2019-04-19 ENCOUNTER — Encounter: Payer: Self-pay | Admitting: Family Medicine

## 2019-04-19 ENCOUNTER — Ambulatory Visit (INDEPENDENT_AMBULATORY_CARE_PROVIDER_SITE_OTHER): Payer: 59 | Admitting: Family Medicine

## 2019-04-19 VITALS — Temp 97.4°F | Ht 70.05 in | Wt 200.0 lb

## 2019-04-19 DIAGNOSIS — Z7189 Other specified counseling: Secondary | ICD-10-CM | POA: Diagnosis not present

## 2019-04-19 NOTE — Progress Notes (Signed)
Virtual visit completed through MyChart. Due to national recommendations of social distancing due to COVID-19, a virtual visit is felt to be most appropriate for this patient at this time. Reviewed limitations of a virtual visit.   Patient location: home Provider location: [facility] office If any vitals were documented, they were collected by patient at home unless specified below.    Temp (!) 97.4 F (36.3 C)   Ht 5' 10.05" (1.779 m)   Wt 200 lb (90.7 kg)   BMI 28.66 kg/m    CC: Covid19 concerns Subjective:    Patient ID: Jose Pineda, male    DOB: 01-22-1971, 48 y.o.   MRN: [id number]  HPI: Jose Pineda is a 48 y.o. male presenting on 04/19/2019 for COVID-19 Concerns ([place]ives in [place] summer. Recently returned on 04/15/19. Asx. Quarantining with elderly mother. Wants to know if testing is needed. )   Just returned from living in [place] for 2 years. Visiting for the summer. Has elderly parents. Arrived Tuesday 04/16/2019.  Asks about testing  Denies Covid19 symptoms.  No known Covid19 exposure.  He and 2 sons are staying with his mother at this time. Doing self quarantine for full 2 weeks. Asks about further advice.      Relevant past medical, surgical, family and social history reviewed and updated as indicated. Interim medical history since our last visit reviewed. Allergies and medications reviewed and updated. Outpatient Medications Prior to Visit  Medication Sig Dispense Refill  . Ascorbic Acid (VITAMIN C PO) Take 1 tablet by mouth daily.    . cetirizine (ZYRTEC ALLERGY) 10 MG tablet Take 10 mg by mouth daily as needed for allergies.    . fluticasone (FLONASE) 50 MCG/ACT nasal spray Place 2 sprays into both nostrils daily as needed for allergies or rhinitis.    Marland Kitchen. ibuprofen (ADVIL,MOTRIN) 200 MG tablet Take 400 mg by mouth every 6 (six) hours as needed (back pain).    . Multiple Vitamin (MULTIVITAMIN WITH MINERALS) TABS tablet Take 1  tablet by mouth daily.     No facility-administered medications prior to visit.      Per HPI unless specifically indicated in ROS section below Review of Systems Objective:    Temp (!) 97.4 F (36.3 C)   Ht 5' 10.05" (1.779 m)   Wt 200 lb (90.7 kg)   BMI 28.66 kg/m   Wt Readings from Last 3 Encounters:  04/19/19 200 lb (90.7 kg)  04/19/17 202 lb 8 oz (91.9 kg)  02/13/17 196 lb 12 oz (89.2 kg)     Physical exam: Gen: alert, NAD, not ill appearing Pulm: speaks in complete sentences without increased work of breathing Psych: normal mood, normal thought content      Assessment & Plan:   Problem List Items Addressed This Visit    Advice Given About Covid-19 Virus Infection - Primary    Reviewed CDC guidelines. He is following them.  Discussed option for Covid19 testing, also discussed how results wouldn't change recommendations. Will not test at this time. Reviewed further social distancing measures for living with his mother at home. Update us if new symptoms develop. Pt agrees with plan.           No orders of the defined types were placed in this encounter.  No orders of the defined types were placed in this encounter.   I discussed the assessment and treatment plan with the patient. The patient was provided an opportunity to  ask questions and all were answered. The patient agreed with the plan and demonstrated an understanding of the instructions. The patient was advised to call back or seek an in-person evaluation if the symptoms worsen or if the condition fails to improve as anticipated.  Follow up plan: No follow-ups on file.  Ria Bush, MD

## 2019-04-19 NOTE — Telephone Encounter (Signed)
Thanks

## 2019-04-19 NOTE — Assessment & Plan Note (Signed)
Reviewed CDC guidelines. He is following them.  Discussed option for Covid19 testing, also discussed how results wouldn't change recommendations. Will not test at this time. Reviewed further social distancing measures for living with his mother at home. Update [place] if new symptoms develop. Pt agrees with plan.

## 2020-04-28 ENCOUNTER — Ambulatory Visit (INDEPENDENT_AMBULATORY_CARE_PROVIDER_SITE_OTHER): Payer: 59 | Admitting: Podiatry

## 2020-04-28 ENCOUNTER — Ambulatory Visit (INDEPENDENT_AMBULATORY_CARE_PROVIDER_SITE_OTHER): Payer: 59

## 2020-04-28 ENCOUNTER — Other Ambulatory Visit: Payer: Self-pay

## 2020-04-28 ENCOUNTER — Encounter: Payer: Self-pay | Admitting: Podiatry

## 2020-04-28 DIAGNOSIS — M21622 Bunionette of left foot: Secondary | ICD-10-CM

## 2020-04-28 DIAGNOSIS — M2012 Hallux valgus (acquired), left foot: Secondary | ICD-10-CM

## 2020-04-28 DIAGNOSIS — M79672 Pain in left foot: Secondary | ICD-10-CM

## 2020-04-28 DIAGNOSIS — M216X2 Other acquired deformities of left foot: Secondary | ICD-10-CM | POA: Diagnosis not present

## 2020-04-28 DIAGNOSIS — Q666 Other congenital valgus deformities of feet: Secondary | ICD-10-CM | POA: Diagnosis not present

## 2020-04-28 DIAGNOSIS — M7752 Other enthesopathy of left foot: Secondary | ICD-10-CM

## 2020-04-28 NOTE — Progress Notes (Signed)
Subjective:  Patient ID: Jose Pineda, male    DOB: August 25, 1971,  MRN: [id number]  Chief Complaint  Patient presents with   Nail Problem    pt is here for a possible bunion of the left foot medial side, pain has been going on for about a year, but states that the pain has been getting worse this past 6 months. 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    49 y.o. male presents with the above complaint.  Patient presents with complaint of bunionette pain to the left fifth metatarsal head.  Patient states that there is soreness with a mild callus formation to the lateral aspect of the fifth metatarsal head.  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  He states that both of his feet have not been the same since since afterwards.  He would like to discuss treatment options as well as see if there is any shoe gear modification he can make.  He has tried sign over-the-counter orthotics.  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  Patient has tried tennis shoes which has not helped.  Pain scale is 9 out of 10 especially to the left foot.  Pain has gotten worse over the last 6 months.  He denies any other acute complaints.   Review of Systems: Negative except as noted in the HPI. Denies N/V/F/Ch.  Past Medical History:  Diagnosis Date   Achilles tendinitis    FH: pulmonary embolism    GERD (gastroesophageal reflux disease)    History of hiatal hernia     Current Outpatient Medications:    Ascorbic Acid (VITAMIN C PO), Take 1 tablet by mouth daily., Disp: , Rfl:    cetirizine (ZYRTEC ALLERGY) 10 MG tablet, Take 10 mg by mouth daily as needed for allergies., Disp: , Rfl:    fluticasone (FLONASE) 50 MCG/ACT nasal spray, Place 2 sprays into both nostrils daily as needed for allergies or rhinitis., Disp: , Rfl:    ibuprofen (ADVIL,MOTRIN) 200 MG  tablet, Take 400 mg by mouth every 6 (six) hours as needed (back pain)., Disp: , Rfl:    Multiple Vitamin (MULTIVITAMIN WITH MINERALS) TABS tablet, Take 1 tablet by mouth daily., Disp: , Rfl:   Social History   Tobacco Use  Smoking Status Former Smoker  Smokeless Tobacco Never Used    Allergies  Allergen Reactions   Mobic [Meloxicam] Other (See Comments)    Heartburn    Objective:  There were no vitals filed for this visit. There is no height or weight on file to calculate BMI. Constitutional Well developed. Well nourished.  Vascular Dorsalis pedis pulses palpable bilaterally. Posterior tibial pulses palpable bilaterally. Capillary refill normal to all digits.  No cyanosis or clubbing noted. Pedal hair growth normal.  Neurologic Normal speech. Oriented to person, place, and time. Epicritic sensation to light touch grossly present bilaterally.  Dermatologic Nails well groomed and normal in appearance. No open wounds. No skin lesions.  Orthopedic:  Pain on palpation to the lateral fifth metatarsal head with hyperkeratotic lesion noted of the fifth metatarsal head/palpated.  Tailor's bunion deformity clearly visualized.  Adductovarus rotation of the fifth digit noted as well.  Upon stimulating weightbearing the tailor's bunion tends to be more flexible and therefore more splayed out.  Patient   Radiographs: 3 views of skeletally  mature adult left foot: There is an increase in intermetatarsal angle between the fourth and fifth ray.  There is a mild lateral deviation angle increase as well.  There is adductovarus of the fourth and fifth digit.  Hammertoe contractures 2 through 4 noted as well.  No bunion/hallux abductor valgus deformity noted.  Sesamoid positions are within normal limits.  Mild hypertrophy of the fifth metatarsal base noted.  Assessment:   1. Tailor's bunionette, left   2. Left foot pain   3. Calcaneus deformity, acquired, left   4. Pes planovalgus    Plan:    Patient was evaluated and treated and all questions answered.  Left tailor's bunionette with underlying pes planovalgus deformity -I explained to the patient the etiology of bunionette in the setting of underlying pes planovalgus deformity/calcaneal deformity leading to unlocking the frontal/forefoot therefore allowing the fifth metatarsal head to be more splayed out.  Clinically patient is having a lot of pain on palpation however which shoe gear modification he has been able to tolerate it well.  Given the amount of pain that he is having I believe he will benefit from a steroid injection to help decrease some of the inflammation that is associated with capsulitis.  Patient agrees with the plan would like to proceed with steroid injection -A steroid injection was performed at left fifth MPJ using 1% plain Lidocaine and 10 mg of Kenalog. This was well tolerated.  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  I believe patient will benefit from orthotics to help support the arch of the foot control the hindfoot motion and therefore take the stress off of the tailor's bunion.  Patient tends to hike as well and he will benefit from orthotics that can go into hiking shoes. -I will have him follow-up with [facility] for custom-made orthotics No follow-ups on file.

## 2020-04-29 ENCOUNTER — Other Ambulatory Visit: Payer: Self-pay | Admitting: Podiatry

## 2020-04-29 DIAGNOSIS — M21622 Bunionette of left foot: Secondary | ICD-10-CM

## 2020-05-20 ENCOUNTER — Other Ambulatory Visit: Payer: Self-pay

## 2020-05-20 ENCOUNTER — Ambulatory Visit (INDEPENDENT_AMBULATORY_CARE_PROVIDER_SITE_OTHER): Payer: 59 | Admitting: Orthotics

## 2020-05-20 ENCOUNTER — Other Ambulatory Visit: Payer: Self-pay | Admitting: Family Medicine

## 2020-05-20 DIAGNOSIS — Z125 Encounter for screening for malignant neoplasm of prostate: Secondary | ICD-10-CM

## 2020-05-20 DIAGNOSIS — Z13220 Encounter for screening for lipoid disorders: Secondary | ICD-10-CM

## 2020-05-20 DIAGNOSIS — Z131 Encounter for screening for diabetes mellitus: Secondary | ICD-10-CM

## 2020-05-20 DIAGNOSIS — M216X1 Other acquired deformities of right foot: Secondary | ICD-10-CM

## 2020-05-20 DIAGNOSIS — M21622 Bunionette of left foot: Secondary | ICD-10-CM

## 2020-05-20 DIAGNOSIS — M216X2 Other acquired deformities of left foot: Secondary | ICD-10-CM | POA: Diagnosis not present

## 2020-05-20 NOTE — Progress Notes (Signed)
Patient cast today for f/o toa address painufl tailors bunionnette left.  Plan of f/o hug arch with 5th offload.

## 2020-05-28 ENCOUNTER — Encounter: Payer: Self-pay | Admitting: Family Medicine

## 2020-06-01 ENCOUNTER — Other Ambulatory Visit: Payer: Self-pay

## 2020-06-01 ENCOUNTER — Other Ambulatory Visit (INDEPENDENT_AMBULATORY_CARE_PROVIDER_SITE_OTHER): Payer: 59

## 2020-06-01 DIAGNOSIS — Z131 Encounter for screening for diabetes mellitus: Secondary | ICD-10-CM

## 2020-06-01 DIAGNOSIS — Z125 Encounter for screening for malignant neoplasm of prostate: Secondary | ICD-10-CM | POA: Diagnosis not present

## 2020-06-01 DIAGNOSIS — Z13220 Encounter for screening for lipoid disorders: Secondary | ICD-10-CM

## 2020-06-01 LAB — LIPID PANEL
Cholesterol: 185 mg/dL (ref 0–200)
HDL: 75.5 mg/dL (ref >39.00)
LDL Cholesterol: 98 mg/dL (ref 0–99)
NonHDL: 109.32
Total CHOL/HDL Ratio: 2
Triglycerides: 57 mg/dL (ref 0.0–149.0)
VLDL: 11.4 mg/dL (ref 0.0–40.0)

## 2020-06-01 LAB — PSA: PSA: 3.49 ng/mL (ref 0.10–4.00)

## 2020-06-01 LAB — GLUCOSE, RANDOM: Glucose, Bld: 96 mg/dL (ref 70–99)

## 2020-06-02 ENCOUNTER — Ambulatory Visit: Payer: 59 | Admitting: Orthotics

## 2020-06-02 DIAGNOSIS — M216X2 Other acquired deformities of left foot: Secondary | ICD-10-CM

## 2020-06-02 DIAGNOSIS — M21622 Bunionette of left foot: Secondary | ICD-10-CM

## 2020-06-02 NOTE — Progress Notes (Signed)
Patient came in today to pick up custom made foot orthotics.  The goals were accomplished and the patient reported no dissatisfaction with said orthotics.  Patient was advised of breakin period and how to report any issues. 

## 2020-06-08 ENCOUNTER — Ambulatory Visit (INDEPENDENT_AMBULATORY_CARE_PROVIDER_SITE_OTHER): Payer: 59 | Admitting: Family Medicine

## 2020-06-08 ENCOUNTER — Other Ambulatory Visit: Payer: Self-pay

## 2020-06-08 ENCOUNTER — Ambulatory Visit: Payer: 59 | Admitting: Orthotics

## 2020-06-08 ENCOUNTER — Encounter: Payer: Self-pay | Admitting: Family Medicine

## 2020-06-08 ENCOUNTER — Ambulatory Visit (INDEPENDENT_AMBULATORY_CARE_PROVIDER_SITE_OTHER)
Admission: RE | Admit: 2020-06-08 | Discharge: 2020-06-08 | Disposition: A | Discharge status: Home / Self Care | Payer: 59 | Source: Ambulatory Visit | Attending: Family Medicine | Admitting: Family Medicine

## 2020-06-08 VITALS — BP 136/80 | HR 80 | Temp 97.5°F | Ht 70.05 in | Wt 217.2 lb

## 2020-06-08 DIAGNOSIS — Z0000 Encounter for general adult medical examination without abnormal findings: Secondary | ICD-10-CM

## 2020-06-08 DIAGNOSIS — Z7189 Other specified counseling: Secondary | ICD-10-CM

## 2020-06-08 DIAGNOSIS — R972 Elevated prostate specific antigen [PSA]: Secondary | ICD-10-CM

## 2020-06-08 DIAGNOSIS — M792 Neuralgia and neuritis, unspecified: Secondary | ICD-10-CM | POA: Diagnosis not present

## 2020-06-08 DIAGNOSIS — M21622 Bunionette of left foot: Secondary | ICD-10-CM

## 2020-06-08 DIAGNOSIS — M216X2 Other acquired deformities of left foot: Secondary | ICD-10-CM

## 2020-06-08 DIAGNOSIS — M79672 Pain in left foot: Secondary | ICD-10-CM

## 2020-06-08 NOTE — Progress Notes (Signed)
This visit occurred during the SARS-CoV-2 public health emergency.  Safety protocols were in place, including screening questions prior to the visit, additional usage of staff PPE, and extensive cleaning of exam room while observing appropriate contact time as indicated for disinfecting solutions.  CPE- See plan.  Routine anticipatory guidance given to patient.  See health maintenance.  The possibility exists that previously documented standard health maintenance information may have been brought forward from a previous encounter into this note.  If needed, that same information has been updated to reflect the current situation based on today's encounter.    Tetanus done ~2012 per patient report.  Flu done yearly.   PNA and shingles not due. covid vaccine 2021 Living will d/w pt.  Wife designated if patient were incapacitated.   Diet and exercise d/w pt, doing well on both.  Colon cancer screening not due.  [facility]. He didn't have f/u overseas about previous elevated PSA.  His stream is good.  No FH prostate cancer.    He had nonbothersome B inguinal hernias.    His father is on hospice at [facility].  Discussed.  Condolences offered.  He had ER eval last year in [place] for PNA (tested neg for covid at the time).  He had a rash shortly thereafter but he was advised it was not an allergic reaction.  no lip or tongue swelling.  He stayed on the full course of abx and that argues against an allergic reaction.  D/w pt. no symptoms now.  He has been going to the chiropractor for years.  He hiked 3 days on the [place] this summer.  He had R sided neck/shoulder pain after the 2nd day.  He had imaging done years ago.  Pain is better in the meantime but he still has R 3rd finger paresthesia.  occ R radicular arm pain.  No weakness.  No L sided pain.    PMH and SH reviewed  Meds, vitals, and allergies reviewed.   ROS: Per HPI.  Unless specifically  indicated otherwise in HPI, the patient denies:  General: fever. Eyes: acute vision changes ENT: sore throat Cardiovascular: chest pain Respiratory: SOB GI: vomiting GU: dysuria Musculoskeletal: acute back pain Derm: acute rash Neuro: acute motor dysfunction Psych: worsening mood Endocrine: polydipsia Heme: bleeding Allergy: hayfever  GEN: nad, alert and oriented HEENT: ncat NECK: supple w/o LA CV: rrr. PULM: ctab, no inc wob ABD: soft, +bs EXT: no edema SKIN: no acute rash Normal strength and sensation in the bilateral upper extremities. Strength and sensation grossly normal in the lower extremities.

## 2020-06-08 NOTE — Patient Instructions (Addendum)
Take care.  Glad to see you. Update me as needed.  Go to the lab on the way out.   If you have mychart we'll likely use that to update you.

## 2020-06-08 NOTE — Progress Notes (Signed)
Added valgus RF wedge to left.

## 2020-06-09 ENCOUNTER — Ambulatory Visit: Payer: 59 | Admitting: Podiatry

## 2020-06-10 DIAGNOSIS — M792 Neuralgia and neuritis, unspecified: Secondary | ICD-10-CM | POA: Insufficient documentation

## 2020-06-10 NOTE — Assessment & Plan Note (Signed)
Tetanus done ~2012 per patient report.  Flu done yearly.   PNA and shingles not due. covid vaccine 2021 Living will d/w pt.  Wife designated if patient were incapacitated.   Diet and exercise d/w pt, doing well on both.  Colon cancer screening not due.  [facility] PSA w[facility] 2021. He didn't have f/u overseas about previous elevated PSA.  His stream is good.  No FH prostate cancer.

## 2020-06-10 NOTE — Assessment & Plan Note (Signed)
Without weakness, improved in the meantime.  Reasonable to check imaging today.  See notes on imaging.  Okay for outpatient follow-up.

## 2020-06-10 NOTE — Assessment & Plan Note (Signed)
Resolved on recheck 

## 2020-06-10 NOTE — Assessment & Plan Note (Signed)
Living will d/w pt.  Wife designated if patient were incapacitated.   ?

## 2020-12-28 ENCOUNTER — Encounter: Payer: Self-pay | Admitting: Family Medicine

## 2020-12-30 ENCOUNTER — Encounter: Payer: Self-pay | Admitting: Family Medicine

## 2021-06-29 IMAGING — DX DG CERVICAL SPINE COMPLETE 4+V
6 series · 6 of 6 positions shown · non-contrast
Comparison: None.

CLINICAL DATA: Right-sided neck pain.

EXAM:
CERVICAL SPINE - COMPLETE 4+ VIEW

[c-spine lat]
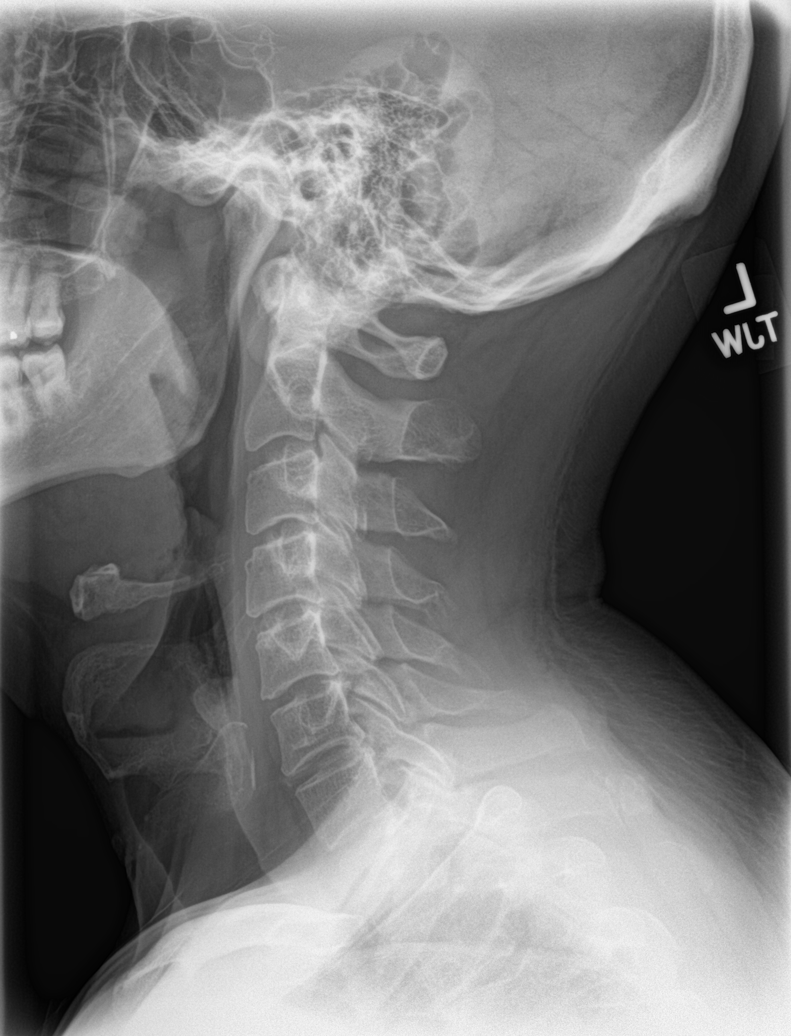

[c-spine obl (1 of 2)]
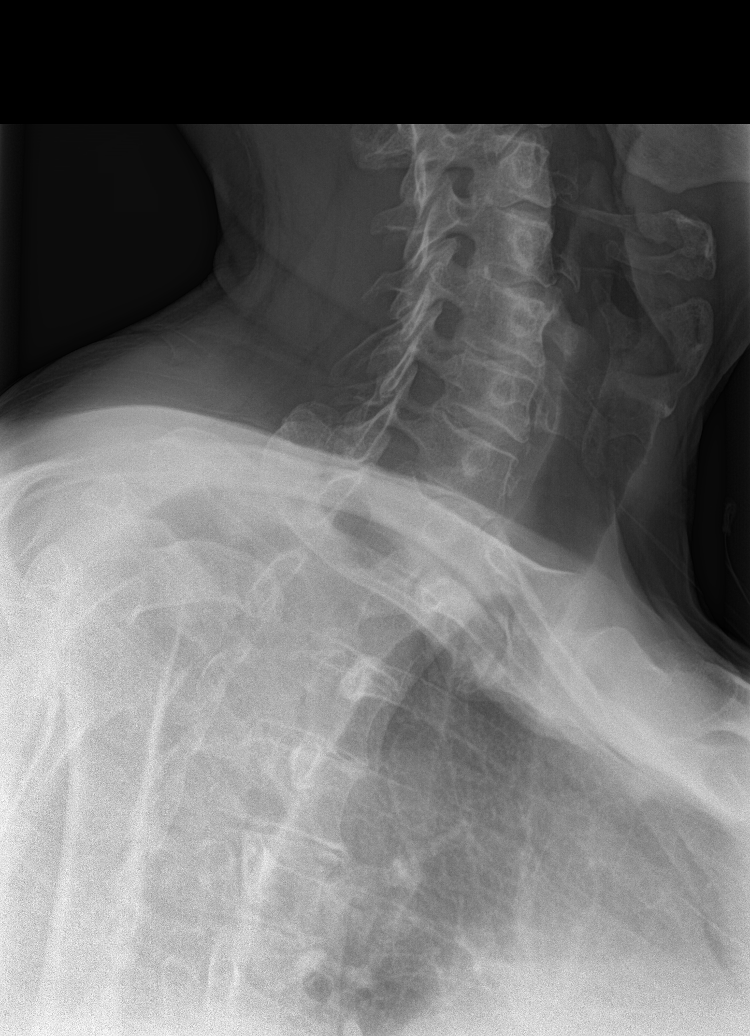

[c-spine obl (2 of 2)]
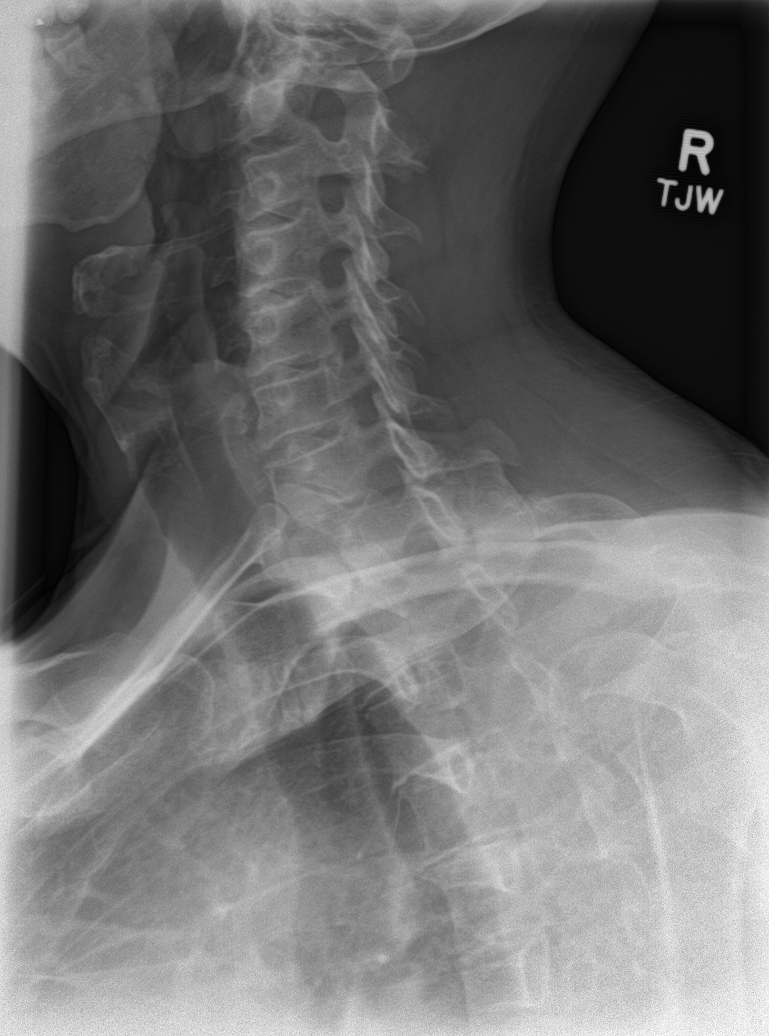

[c-spine ap]
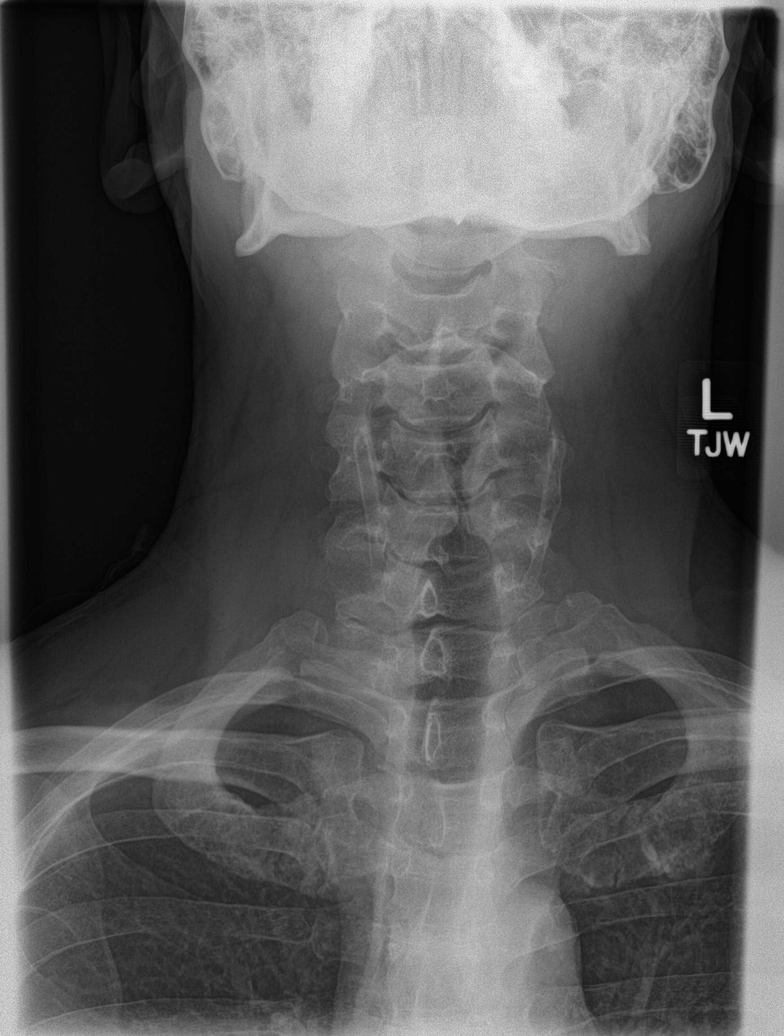

[c-spine open mouth]
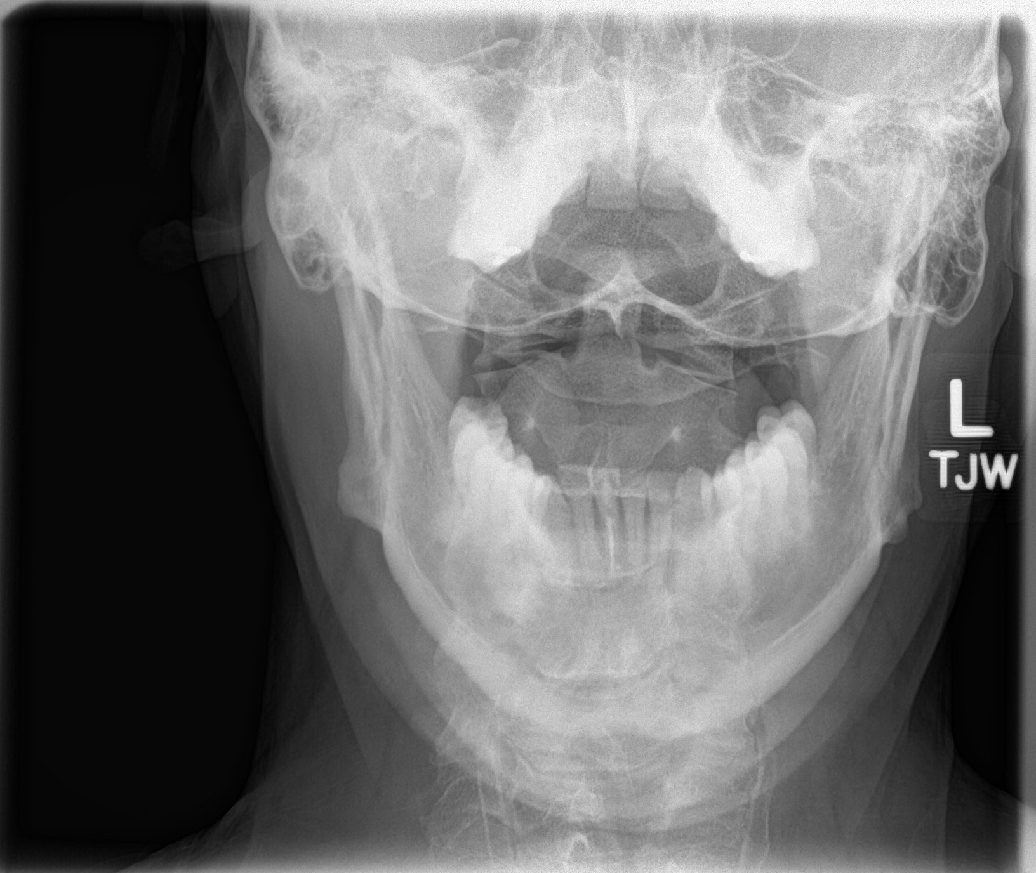

[c-spine swimmers]
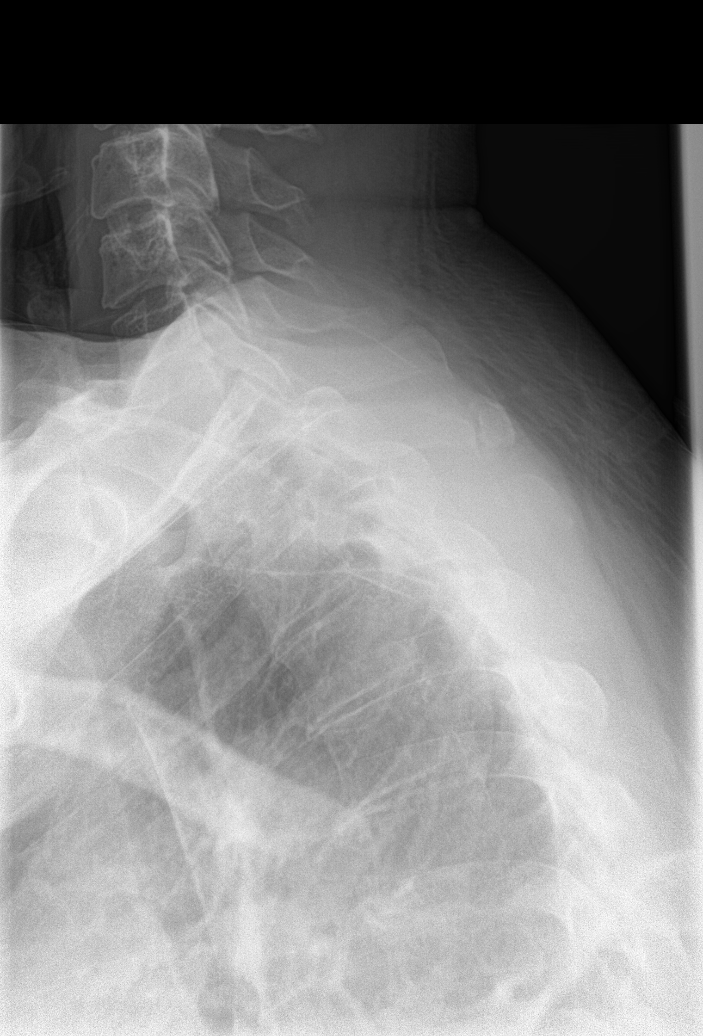

[6 of 6 positions shown; findings below may reference images not displayed]

FINDINGS: Mild multilevel degenerative changes are noted throughout the
cervical spine, greatest at the C6-C7 level. There is no
prevertebral soft tissue edema. There is osseous neural foraminal
narrowing at the C3-C4 and C5-C6 levels on the left. There is
osseous neural foraminal narrowing at the C5-C6 level on the right.
There is no acute fracture. No significant malalignment.
IMPRESSION: 1. No acute abnormality.
2. Multilevel degenerative changes with bilateral osseous neural
foraminal narrowing as detailed above.

## 2021-12-14 HISTORY — PX: HERNIA REPAIR: SHX51

## 2022-04-25 ENCOUNTER — Encounter: Payer: Self-pay | Admitting: Family Medicine

## 2022-04-28 ENCOUNTER — Ambulatory Visit (INDEPENDENT_AMBULATORY_CARE_PROVIDER_SITE_OTHER): Payer: 59 | Admitting: Podiatry

## 2022-04-28 DIAGNOSIS — Q666 Other congenital valgus deformities of feet: Secondary | ICD-10-CM

## 2022-04-29 ENCOUNTER — Ambulatory Visit (INDEPENDENT_AMBULATORY_CARE_PROVIDER_SITE_OTHER): Payer: 59 | Admitting: Family Medicine

## 2022-04-29 ENCOUNTER — Encounter: Payer: Self-pay | Admitting: Family Medicine

## 2022-04-29 VITALS — BP 118/82 | HR 82 | Temp 98.2°F | Ht 70.05 in | Wt 184.0 lb

## 2022-04-29 DIAGNOSIS — Z23 Encounter for immunization: Secondary | ICD-10-CM

## 2022-04-29 DIAGNOSIS — Z0000 Encounter for general adult medical examination without abnormal findings: Secondary | ICD-10-CM | POA: Diagnosis not present

## 2022-04-29 DIAGNOSIS — Z8249 Family history of ischemic heart disease and other diseases of the circulatory system: Secondary | ICD-10-CM | POA: Diagnosis not present

## 2022-04-29 DIAGNOSIS — R519 Headache, unspecified: Secondary | ICD-10-CM

## 2022-04-29 DIAGNOSIS — B360 Pityriasis versicolor: Secondary | ICD-10-CM

## 2022-04-29 DIAGNOSIS — Z7189 Other specified counseling: Secondary | ICD-10-CM

## 2022-04-29 DIAGNOSIS — Z125 Encounter for screening for malignant neoplasm of prostate: Secondary | ICD-10-CM

## 2022-04-29 LAB — COMPREHENSIVE METABOLIC PANEL
ALT: 29 U/L (ref 0–53)
AST: 28 U/L (ref 0–37)
Albumin: 4.5 g/dL (ref 3.5–5.2)
Alkaline Phosphatase: 64 U/L (ref 39–117)
BUN: 18 mg/dL (ref 6–23)
CO2: 32 mEq/L (ref 19–32)
Calcium: 9.6 mg/dL (ref 8.4–10.5)
Chloride: 104 mEq/L (ref 96–112)
Creatinine, Ser: 0.94 mg/dL (ref 0.40–1.50)
GFR: 94.58 mL/min (ref >60.00)
Glucose, Bld: 88 mg/dL (ref 70–99)
Potassium: 4.7 mEq/L (ref 3.5–5.1)
Sodium: 140 mEq/L (ref 135–145)
Total Bilirubin: 0.6 mg/dL (ref 0.2–1.2)
Total Protein: 7.1 g/dL (ref 6.0–8.3)

## 2022-04-29 LAB — LIPID PANEL
Cholesterol: 201 mg/dL — ABNORMAL HIGH (ref 0–200)
HDL: 85.2 mg/dL (ref >39.00)
LDL Cholesterol: 109 mg/dL — ABNORMAL HIGH (ref 0–99)
NonHDL: 115.95
Total CHOL/HDL Ratio: 2
Triglycerides: 34 mg/dL (ref 0.0–149.0)
VLDL: 6.8 mg/dL (ref 0.0–40.0)

## 2022-04-29 LAB — PSA: PSA: 5.24 ng/mL — ABNORMAL HIGH (ref 0.10–4.00)

## 2022-04-29 NOTE — Patient Instructions (Addendum)
Go to the lab on the way out.   If you have mychart we'll likely use that to update you.    Take care.  Glad to see you. Have the eye clinic update me about your visit.    Chart your headaches.  See if you note triggers (lights, computers, caffeine use vs deprivation).  Update me if worse. Gradually try to taper caffeine.

## 2022-04-29 NOTE — Progress Notes (Signed)
CPE- See plan.  Routine anticipatory guidance given to patient.  See health maintenance.  The possibility exists that previously documented standard health maintenance information may have been brought forward from a previous encounter into this note.  If needed, that same information has been updated to reflect the current situation based on today's encounter.    Tetanus 2023 Flu done yearly.   PNA no due.   Shingles d/w pt.  covid vaccine 2021 Living will d/w pt.  Wife designated if patient were incapacitated.   Diet and exercise d/w pt, doing well on both.  Colonoscopy to be done fall 2023 in [place].   [facility].  His stream is good.  No FH prostate cancer.    He is going back to [place] to work at the [employer] in August.    Both sons are going to college this year.  [employer] in [place].    H/o tinea versicolor.  Had used fluconazole in the meantime with f/u dose pending.    Some occ R sided HA.  He has some B lid lag with slightly more tissue overhang on the R side.  He had eye clinic f/u pending.  He has a h/o aura rarely.  D/w pt about migraine dx.  Caffeine helps usually.  Feels better in dark quiet room.  Ibuprofen helps.  Several cups of coffee a day.  HA are usually rare, ie ~1 a month.  Likely going on for years.    PMH and SH reviewed  Meds, vitals, and allergies reviewed.   ROS: Per HPI.  Unless specifically indicated otherwise in HPI, the patient denies:  General: fever. Eyes: acute vision changes ENT: sore throat Cardiovascular: chest pain Respiratory: SOB GI: vomiting GU: dysuria Musculoskeletal: acute back pain Derm: acute rash Neuro: acute motor dysfunction Psych: worsening mood Endocrine: polydipsia Heme: bleeding Allergy: hayfever  GEN: nad, alert and oriented HEENT: ncat NECK: supple w/o LA CV: rrr. PULM: ctab, no inc wob ABD: soft, +bs EXT: no edema SKIN: Well-perfused CN 2-12 wnl B, S/S wnl  x4

## 2022-05-01 DIAGNOSIS — R519 Headache, unspecified: Secondary | ICD-10-CM | POA: Insufficient documentation

## 2022-05-01 DIAGNOSIS — B360 Pityriasis versicolor: Secondary | ICD-10-CM | POA: Insufficient documentation

## 2022-05-01 NOTE — Assessment & Plan Note (Signed)
Likely migraine.  Discussed options.  Would gradually taper caffeine.  I asked him to chart his  headaches.  He will see if he can note triggers (lights, computers, caffeine use vs deprivation).  Update me if worse.  Migraine pathophysiology discussed with patient.

## 2022-05-01 NOTE — Assessment & Plan Note (Signed)
Continue fluconazole per dermatology.

## 2022-05-01 NOTE — Assessment & Plan Note (Signed)
Living will d/w pt.  Wife designated if patient were incapacitated.   ?

## 2022-05-01 NOTE — Assessment & Plan Note (Signed)
Tetanus 2023 Flu done yearly.   PNA no due.   Shingles d/w pt.  covid vaccine 2021 Living will d/w pt.  Wife designated if patient were incapacitated.   Diet and exercise d/w pt, doing well on both.  Colonoscopy to be done fall 2023 in [place].   [facility].  His stream is good.  No FH prostate cancer.    He is going back to [place] to work at the [employer] in August.    Both sons are going to college this year.  [employer] in [place].

## 2022-05-04 ENCOUNTER — Encounter: Payer: Self-pay | Admitting: Family Medicine

## 2022-05-04 ENCOUNTER — Other Ambulatory Visit: Payer: Self-pay | Admitting: Family Medicine

## 2022-05-04 DIAGNOSIS — R972 Elevated prostate specific antigen [PSA]: Secondary | ICD-10-CM

## 2022-05-05 NOTE — Progress Notes (Signed)
Subjective:  Patient ID: Jose Pineda, male    DOB: May 22, 1971,  MRN: [id number]  Chief Complaint  Patient presents with   Foot Orthotics    51 y.o. male presents with the above complaint.  Patient presents with follow-up of bilateral flatfoot deformity with tailor's bunion and with submetatarsal 5 pain.  Patient's pain is manageable.  He states the orthotics are very helpful he would like to get another pair of orthotics.   Review of Systems: Negative except as noted in the HPI. Denies N/V/F/Ch.  Past Medical History:  Diagnosis Date   Achilles tendinitis    FH: pulmonary embolism    GERD (gastroesophageal reflux disease)    History of hiatal hernia     Current Outpatient Medications:    Ascorbic Acid (VITAMIN C PO), Take 1 tablet by mouth daily., Disp: , Rfl:    cetirizine (ZYRTEC ALLERGY) 10 MG tablet, Take 10 mg by mouth daily as needed for allergies., Disp: , Rfl:    fluconazole (DIFLUCAN) 200 MG tablet, Take by mouth., Disp: , Rfl:    fluticasone (FLONASE) 50 MCG/ACT nasal spray, Place 2 sprays into both nostrils daily as needed for allergies or rhinitis., Disp: , Rfl:    ibuprofen (ADVIL,MOTRIN) 200 MG tablet, Take 400 mg by mouth every 6 (six) hours as needed (back pain)., Disp: , Rfl:    Multiple Vitamin (MULTIVITAMIN WITH MINERALS) TABS tablet, Take 1 tablet by mouth daily., Disp: , Rfl:   Social History   Tobacco Use  Smoking Status Former  Smokeless Tobacco Never    Allergies  Allergen Reactions   Mobic [Meloxicam] Other (See Comments)    Heartburn    Objective:  There were no vitals filed for this visit. There is no height or weight on file to calculate BMI. Constitutional Well developed. Well nourished.  Vascular Dorsalis pedis pulses palpable bilaterally. Posterior tibial pulses palpable bilaterally. Capillary refill normal to all digits.  No cyanosis or clubbing noted. Pedal hair growth normal.  Neurologic Normal speech. Oriented to person,  place, and time. Epicritic sensation to light touch grossly present bilaterally.  Dermatologic Nails well groomed and normal in appearance. No open wounds. No skin lesions.  Orthopedic: No pain on palpation to the lateral fifth metatarsal head with hyperkeratotic lesion noted of the fifth metatarsal head/palpated.  Tailor's bunion deformity clearly visualized.  Adductovarus rotation of the fifth digit noted as well.  Upon stimulating weightbearing the tailor's bunion tends to be more flexible and therefore more splayed out.  Gait examination shows pes planovalgus foot structure   Radiographs: 3 views of skeletally mature adult left foot: There is an increase in intermetatarsal angle between the fourth and fifth ray.  There is a mild lateral deviation angle increase as well.  There is adductovarus of the fourth and fifth digit.  Hammertoe contractures 2 through 4 noted as well.  No bunion/hallux abductor valgus deformity noted.  Sesamoid positions are within normal limits.  Mild hypertrophy of the fifth metatarsal base noted.  Assessment:   No diagnosis found.  Plan:  Patient was evaluated and treated and all questions answered.  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  I will hold off on injection.  And orthotics I discussed shoe gear modification and offloading of bilateral 5.  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  I believe patient will benefit from  orthotics to help support the arch of the foot control the hindfoot motion and therefore take the stress off of the tailor's bunion.  Patient tends to hike as well and he will benefit from orthotics that can go into hiking shoes. -Patient was casted for orthotics with offloading of submetatarsal 5 No follow-ups on file.

## 2022-05-10 ENCOUNTER — Ambulatory Visit (INDEPENDENT_AMBULATORY_CARE_PROVIDER_SITE_OTHER): Payer: 59 | Admitting: Urology

## 2022-05-10 ENCOUNTER — Ambulatory Visit: Payer: Self-pay | Admitting: Urology

## 2022-05-10 ENCOUNTER — Encounter: Payer: Self-pay | Admitting: Urology

## 2022-05-10 VITALS — BP 131/78 | HR 77 | Ht 71.0 in | Wt 189.0 lb

## 2022-05-10 DIAGNOSIS — R972 Elevated prostate specific antigen [PSA]: Secondary | ICD-10-CM

## 2022-05-10 NOTE — Patient Instructions (Addendum)
Prostate Cancer Screening  Prostate cancer screening is testing that is done to check for the presence of prostate cancer in men. The prostate gland is a walnut-sized gland that is located below the bladder and in front of the rectum in males. The function of the prostate is to add fluid to semen during ejaculation. Prostate cancer is one of the most common types of cancer in men. Who should have prostate cancer screening? Screening recommendations vary based on age and other risk factors, as well as between the professional organizations who make the recommendations. In general, screening is recommended if: You are age 50 to 70 and have an average risk for prostate cancer. You should talk with your health care provider about your need for screening and how often screening should be done. Because most prostate cancers are slow growing and will not cause death, screening in this age group is generally reserved for men who have a 10- to 15-year life expectancy. You are younger than age 50, and you have these risk factors: Having a father, brother, or uncle who has been diagnosed with prostate cancer. The risk is higher if your family member's cancer occurred at an early age or if you have multiple family members with prostate cancer at an early age. Being a male who is Black or is of Caribbean or sub-Saharan African descent. In general, screening is not recommended if: You are younger than age 40. You are between the ages of 40 and 49 and you have no risk factors. You are 70 years of age or older. At this age, the risks that screening can cause are greater than the benefits that it may provide. If you are at high risk for prostate cancer, your health care provider may recommend that you have screenings more often or that you start screening at a younger age. How is screening for prostate cancer done? The recommended prostate cancer screening test is a blood test called the prostate-specific antigen  (PSA) test. PSA is a protein that is made in the prostate. As you age, your prostate naturally produces more PSA. Abnormally high PSA levels may be caused by: Prostate cancer. An enlarged prostate that is not caused by cancer (benign prostatic hyperplasia, or BPH). This condition is very common in older men. A prostate gland infection (prostatitis) or urinary tract infection. Certain medicines such as male hormones (like testosterone) or other medicines that raise testosterone levels. A rectal exam may be done as part of prostate cancer screening to help provide information about the size of your prostate gland. When a rectal exam is performed, it should be done after the PSA level is drawn to avoid any effect on the results. Depending on the PSA results, you may need more tests, such as: A physical exam to check the size of your prostate gland, if not done as part of screening. Blood and imaging tests. A procedure to remove tissue samples from your prostate gland for testing (biopsy). This is the only way to know for certain if you have prostate cancer. What are the benefits of prostate cancer screening? Screening can help to identify cancer at an early stage, before symptoms start and when the cancer can be treated more easily. There is a small chance that screening may lower your risk of dying from prostate cancer. The chance is small because prostate cancer is a slow-growing cancer, and most men with prostate cancer die from a different cause. What are the risks of prostate cancer screening? The   main risk of prostate cancer screening is diagnosing and treating prostate cancer that would never have caused any symptoms or problems. This is called overdiagnosisand overtreatment. PSA screening cannot tell you if your PSA is high due to cancer or a different cause. A prostate biopsy is the only procedure to diagnose prostate cancer. Even the results of a biopsy may not tell you if your cancer needs to  be treated. Slow-growing prostate cancer may not need any treatment other than monitoring, so diagnosing and treating it may cause unnecessary stress or other side effects. Questions to ask your health care provider When should I start prostate cancer screening? What is my risk for prostate cancer? How often do I need screening? What type of screening tests do I need? How do I get my test results? What do my results mean? Do I need treatment? Where to find more information The American Cancer Society: www.cancer.org American Urological Association: www.auanet.org Contact a health care provider if: You have difficulty urinating. You have pain when you urinate or ejaculate. You have blood in your urine or semen. You have pain in your back or in the area of your prostate. Summary Prostate cancer is a common type of cancer in men. The prostate gland is located below the bladder and in front of the rectum. This gland adds fluid to semen during ejaculation. Prostate cancer screening may identify cancer at an early stage, when the cancer can be treated more easily and is less likely to have spread to other areas of the body. The prostate-specific antigen (PSA) test is the recommended screening test for prostate cancer, but it has associated risks. Discuss the risks and benefits of prostate cancer screening with your health care provider. If you are age 70 or older, the risks that screening can cause are greater than the benefits that it may provide. This information is not intended to replace advice given to you by your health care provider. Make sure you discuss any questions you have with your health care provider. Document Revised: 04/12/2021 Document Reviewed: 04/12/2021 Elsevier Patient Education  2023 Elsevier Inc.  Prostate-Specific Antigen Test Why am I having this test? The prostate-specific antigen (PSA) test is a screening test for prostate cancer. It can identify early signs of  prostate cancer, which may allow for early detection and more effective treatment. Your health care provider may recommend that you have a PSA test starting at age 50 or that you have one earlier if you are at higher risk for prostate cancer. You may also have a PSA test: To monitor treatment of prostate cancer. To check whether prostate cancer has returned after treatment. What is being tested? This test measures the amount of PSA in your blood. PSA is a protein that is made in the prostate. The prostate naturally produces more PSA as you age, but very high levels may be a sign of a medical condition. What kind of sample is taken?  A blood sample is required for this test. It is usually collected by inserting a needle into a blood vessel but can also be collected by sticking a finger with a small needle. Blood for this test should be drawn before having an exam of the prostate that involves digital rectal examination to avoid affecting the results. How do I prepare for this test? Do not ejaculate starting 24 hours before your test, or as long as told by your health care provider, as this can cause an elevation in PSA. Do not undergo   any procedures that require manipulation of the prostate, such as biopsy or surgery, for 6 weeks before the test is done as this can cause an elevation in PSA. Tell a health care provider about: Any signs you may have of other conditions that can affect PSA levels, such as: An enlarged prostate that is not caused by cancer (benign prostatic hyperplasia, or BPH). This condition is very common in older men. A prostate or urinary tract infection. Any allergies you have. All medicines you are taking, including vitamins, herbs, eye drops, creams, and over-the-counter medicines. This also includes: Medicines to assist with hair growth, such as finasteride. Any recent exposure to a medicine called diethylstilbestrol (DES). Medicines such as male hormones (like testosterone)  or other medicines that raise testosterone levels. Any bleeding problems you have. Any recent procedures you have had, especially any procedures involving the prostate or rectum. Any medical conditions you have. How are the results reported? Your test results will be reported as a value that indicates how much PSA is in your blood. This will be given as nanograms of PSA per milliliter of blood (ng/mL). Your health care provider will compare your results to normal ranges that were established after testing a large group of people (reference ranges). Reference ranges may vary among labs and hospitals. PSA levels vary from person to person and generally increase with age. Because of this variation, there is no single PSA value that is considered normal for everyone. Instead, PSA reference ranges are used to describe whether your PSA levels are considered low or high (elevated). Common reference ranges are: Low: 0-2.5 ng/mL. Slightly to moderately elevated: 2.6-10.0 ng/mL. Moderately elevated: 10.0-19.9 ng/mL. Significantly elevated: 20 ng/mL or greater. What do the results mean? A test result that is higher than 4 ng/mL may mean that you have prostate cancer. However, a PSA test by itself is not enough to diagnose prostate cancer. High PSA levels may also be caused by the natural aging process, prostate infection (prostatitis), or BPH. PSA screening cannot tell you if your PSA is high due to cancer or a different cause. A prostate biopsy is the only way to diagnose prostate cancer. A risk of having the PSA test is diagnosing and treating prostate cancer that would never have caused any symptoms or problems (overdiagnosis and overtreatment). Talk with your health care provider about what your results mean. In some cases, your health care provider may do more testing to confirm the results. Questions to ask your health care provider Ask your health care provider, or the department that is doing the  test: When will my results be ready? How will I get my results? What are my treatment options? What other tests do I need? What are my next steps? Summary The prostate-specific antigen (PSA) test is a screening test for prostate cancer. Your health care provider may recommend that you have a PSA test starting at age 50 or that you have one earlier if you are at higher risk for prostate cancer. A test result that is higher than 4 ng/mL may mean that you have prostate cancer. However, elevated levels can be caused by a number of conditions other than prostate cancer. Talk with your health care provider about what your results mean. This information is not intended to replace advice given to you by your health care provider. Make sure you discuss any questions you have with your health care provider. Document Revised: 02/24/2021 Document Reviewed: 02/24/2021 Elsevier Patient Education  2023 Elsevier Inc.  

## 2022-05-10 NOTE — Progress Notes (Signed)
05/10/22 11:32 AM   Jose Pineda 02-16-1971 [id number]  CC: Elevated PSA  HPI: Jose Pineda is a healthy 51 year old male who works as a Runner, broadcasting/film/video primarily overseas in [place], and spends the summers in the [place].  He was recently found to have a mildly elevated PSA of 5.24.  He actually had a elevated PSA of 6.7(10% free) in June 2018 but I do not see that he ever saw urologist for this.  PSA in August 2021 was 3.49.  He denies any urinary symptoms or gross hematuria.  Denies any family history of prostate cancer.  He is leaving to return to [place] 8/10.   PMH: Past Medical History:  Diagnosis Date   Achilles tendinitis    FH: pulmonary embolism    GERD (gastroesophageal reflux disease)    History of hiatal hernia     Surgical History: Past Surgical History:  Procedure Laterality Date   ESOPHAGEAL MANOMETRY N/A 08/03/2015   Procedure: ESOPHAGEAL MANOMETRY (EM);  Surgeon: Dorena Cookey, MD;  Location: [facility];  Service: Endoscopy;  Laterality: N/A;  Dr. Charlott Rakes to read.    HERNIA REPAIR Left 12/14/2021   inguinal   LAPAROSCOPIC NISSEN FUNDOPLICATION N/A 10/16/2015   Procedure: LAPAROSCOPIC NISSEN FUNDOPLICATION & HIATAL HERNIA REPAIR, upper endoscopy;  Surgeon: Luretha Murphy, MD;  Location: [facility];  Service: General;  Laterality: N/A;     Family History: Family History  Problem Relation Age of Onset   Pulmonary embolism Mother    Bladder Cancer Mother    Hypertension Father    Arthritis Father    Diabetes Father    CAD Father    Pulmonary embolism Other        mult distant family members with DVT/PE, but not his sibs   Colon cancer Neg Hx    Prostate cancer Neg Hx     Social History:  reports that he has quit smoking. He has been exposed to tobacco smoke. He has never used smokeless tobacco. He reports that he does not drink alcohol and does not use drugs.  Physical Exam: BP 131/78   Pulse 77   Ht 5\' 11"  (1.803 m)   Wt 189 lb (85.7 kg)   BMI 26.36  kg/m    Constitutional:  Alert and oriented, No acute distress. Cardiovascular: No clubbing, cyanosis, or edema. Respiratory: Normal respiratory effort, no increased work of breathing. GI: Abdomen is soft, nontender, nondistended, no abdominal masses DRE: 40 g, smooth, no nodules or masses  Laboratory Data: Reviewed, see HPI   Assessment & Plan:   Healthy 51 year old male with elevated PSA of 5.24.  DRE benign today, and he has a history of fluctuating PSA including 6.7 in 2018 and 3.49 in 2021.  No prior biopsy.  We reviewed the implications of an elevated PSA and the uncertainty surrounding it. In general, a man's PSA increases with age and is produced by both normal and cancerous prostate tissue. The differential diagnosis for elevated PSA includes BPH, prostate cancer, infection, recent intercourse/ejaculation, recent urethroscopic manipulation (foley placement/cystoscopy) or trauma, and prostatitis.   Management of an elevated PSA can include observation or prostate biopsy and we discussed this in detail. Our goal is to detect clinically significant prostate cancers, and manage with either active surveillance, surgery, or radiation for localized disease. Risks of prostate biopsy include bleeding, infection (including life threatening sepsis), pain, and lower urinary symptoms. Hematuria, hematospermia, and blood in the stool are all common after biopsy and can persist up to 4 weeks.  Repeat PSA with reflex to free in 2 to 3 weeks, call with results.  If stable can continue yearly screening, but if >5 may need to consider prostate MRI or biopsy  Legrand Rams, MD 05/10/2022  [facility] [address], [address] [place], [place] [address] [phone]

## 2022-05-30 ENCOUNTER — Other Ambulatory Visit
Admission: RE | Admit: 2022-05-30 | Discharge: 2022-05-30 | Disposition: A | Discharge status: Home / Self Care | Payer: 59 | Attending: Urology | Admitting: Urology

## 2022-05-30 DIAGNOSIS — R972 Elevated prostate specific antigen [PSA]: Secondary | ICD-10-CM | POA: Diagnosis present

## 2022-05-31 ENCOUNTER — Telehealth: Payer: Self-pay

## 2022-05-31 ENCOUNTER — Ambulatory Visit (INDEPENDENT_AMBULATORY_CARE_PROVIDER_SITE_OTHER): Payer: 59 | Admitting: Podiatry

## 2022-05-31 DIAGNOSIS — M216X2 Other acquired deformities of left foot: Secondary | ICD-10-CM

## 2022-05-31 DIAGNOSIS — M21622 Bunionette of left foot: Secondary | ICD-10-CM

## 2022-05-31 DIAGNOSIS — Q666 Other congenital valgus deformities of feet: Secondary | ICD-10-CM | POA: Diagnosis not present

## 2022-05-31 LAB — PSA (REFLEX TO FREE) (SERIAL): Prostate Specific Ag, Serum: 6.9 ng/mL — ABNORMAL HIGH (ref 0.0–4.0)

## 2022-05-31 LAB — FPSA% REFLEX
% FREE PSA: 10.7 %
PSA, FREE: 0.74 ng/mL

## 2022-05-31 NOTE — Telephone Encounter (Signed)
-----   Message from Sondra Come, MD sent at 05/31/2022 12:51 PM EDT ----- PSA remains elevated at 6.9, the good news is this is essentially been stable since 2018, but with his young age and good health I would strongly recommend prostate MRI to evaluate for prostate cancer.  He lives primarily in [place], and other option would be to follow-up with urologist in [place].  Legrand Rams, MD 05/31/2022

## 2022-05-31 NOTE — Progress Notes (Signed)
Subjective:  Patient ID: Jose Pineda, male    DOB: 04/22/1971,  MRN: [id number]  Chief Complaint  Patient presents with   Foot Orthotics    51 y.o. male presents with the above complaint.  Patient presents with follow-up of bilateral flatfoot deformity with tailor's bunion and with submetatarsal 5 pain.  He states doing a lot better.  Minimal pain.  He is here to pick up his orthotics.   Review of Systems: Negative except as noted in the HPI. Denies N/V/F/Ch.  Past Medical History:  Diagnosis Date   Achilles tendinitis    FH: pulmonary embolism    GERD (gastroesophageal reflux disease)    History of hiatal hernia     Current Outpatient Medications:    cetirizine (ZYRTEC ALLERGY) 10 MG tablet, Take 10 mg by mouth daily as needed for allergies., Disp: , Rfl:    fluticasone (FLONASE) 50 MCG/ACT nasal spray, Place 2 sprays into both nostrils daily as needed for allergies or rhinitis., Disp: , Rfl:    ibuprofen (ADVIL,MOTRIN) 200 MG tablet, Take 400 mg by mouth every 6 (six) hours as needed (back pain)., Disp: , Rfl:   Social History   Tobacco Use  Smoking Status Former   Passive exposure: Past  Smokeless Tobacco Never    Allergies  Allergen Reactions   Mobic [Meloxicam] Other (See Comments)    Heartburn    Objective:  There were no vitals filed for this visit. There is no height or weight on file to calculate BMI. Constitutional Well developed. Well nourished.  Vascular Dorsalis pedis pulses palpable bilaterally. Posterior tibial pulses palpable bilaterally. Capillary refill normal to all digits.  No cyanosis or clubbing noted. Pedal hair growth normal.  Neurologic Normal speech. Oriented to person, place, and time. Epicritic sensation to light touch grossly present bilaterally.  Dermatologic Nails well groomed and normal in appearance. No open wounds. No skin lesions.  Orthopedic: No pain on palpation to the lateral fifth metatarsal head with hyperkeratotic  lesion noted of the fifth metatarsal head/palpated.  Tailor's bunion deformity clearly visualized.  Adductovarus rotation of the fifth digit noted as well.  Upon stimulating weightbearing the tailor's bunion tends to be more flexible and therefore more splayed out.  Gait examination shows pes planovalgus foot structure   Radiographs: 3 views of skeletally mature adult left foot: There is an increase in intermetatarsal angle between the fourth and fifth ray.  There is a mild lateral deviation angle increase as well.  There is adductovarus of the fourth and fifth digit.  Hammertoe contractures 2 through 4 noted as well.  No bunion/hallux abductor valgus deformity noted.  Sesamoid positions are within normal limits.  Mild hypertrophy of the fifth metatarsal base noted.  Assessment:   1. Pes planovalgus   2. Tailor's bunionette, left   3. Calcaneus deformity, acquired, left     Plan:  Patient was evaluated and treated and all questions answered.  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  I will hold off on injection.  And orthotics I discussed shoe gear modification and offloading of bilateral 5.  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  I believe patient will benefit from orthotics to help support the arch of the foot control the hindfoot motion and therefore take the stress off of the tailor's bunion.  Patient tends to hike as well and he  will benefit from orthotics that can go into hiking shoes. -Patient is awaiting orthotics No follow-ups on file.

## 2022-05-31 NOTE — Telephone Encounter (Signed)
Called pt, no answer. LM asking pt to return call. Mychart message sent.

## 2022-09-19 ENCOUNTER — Other Ambulatory Visit: Payer: Self-pay

## 2022-09-19 DIAGNOSIS — R972 Elevated prostate specific antigen [PSA]: Secondary | ICD-10-CM

## 2022-10-18 ENCOUNTER — Ambulatory Visit
Admission: RE | Admit: 2022-10-18 | Discharge: 2022-10-18 | Disposition: A | Discharge status: Home / Self Care | Payer: 59 | Source: Ambulatory Visit | Attending: Urology | Admitting: Urology

## 2022-10-18 DIAGNOSIS — R972 Elevated prostate specific antigen [PSA]: Secondary | ICD-10-CM | POA: Diagnosis present

## 2022-10-18 MED ORDER — GADOBUTROL 1 MMOL/ML IV SOLN
9.0000 mL | Freq: Once | INTRAVENOUS | Status: AC | PRN
  Administered 2022-10-18: 9 mL via INTRAVENOUS

## 2022-10-25 ENCOUNTER — Telehealth: Payer: Self-pay

## 2022-10-25 NOTE — Telephone Encounter (Signed)
-----   Message from Sondra Come, MD sent at 10/21/2022  7:24 AM EST ----- MRI overall looks good with no suspicious lesions. Please set up in person or virtual visit to discuss options moving forward  Legrand Rams, MD 10/21/2022

## 2022-10-25 NOTE — Telephone Encounter (Signed)
Called pt informed him of the information below. Pt voiced understanding. Pt is unable to schedule at [place]. Will message me through Mychart in 2wks to coordinate appointment.

## 2022-11-25 DIAGNOSIS — Q666 Other congenital valgus deformities of feet: Secondary | ICD-10-CM

## 2023-04-14 ENCOUNTER — Other Ambulatory Visit: Payer: Self-pay | Admitting: Family Medicine

## 2023-04-14 DIAGNOSIS — Z125 Encounter for screening for malignant neoplasm of prostate: Secondary | ICD-10-CM

## 2023-04-14 DIAGNOSIS — Z8249 Family history of ischemic heart disease and other diseases of the circulatory system: Secondary | ICD-10-CM

## 2023-04-16 ENCOUNTER — Ambulatory Visit
Admission: EM | Admit: 2023-04-16 | Discharge: 2023-04-16 | Disposition: A | Discharge status: Home / Self Care | Payer: 59 | Attending: Emergency Medicine | Admitting: Emergency Medicine

## 2023-04-16 DIAGNOSIS — H6693 Otitis media, unspecified, bilateral: Secondary | ICD-10-CM | POA: Diagnosis not present

## 2023-04-16 DIAGNOSIS — J0100 Acute maxillary sinusitis, unspecified: Secondary | ICD-10-CM | POA: Diagnosis not present

## 2023-04-16 MED ORDER — AMOXICILLIN 875 MG PO TABS: 875.0000 mg | ORAL_TABLET | Freq: Two times a day (BID) | ORAL | 0 refills | Status: AC

## 2023-04-16 NOTE — ED Provider Notes (Signed)
Jose Pineda    CSN: [id number] Arrival date & time: 04/16/23  0945      History   Chief Complaint Chief Complaint  Patient presents with   Ear Fullness    Sinus pain and pressure. Ears very full and painful. - Entered by patient    HPI Jose Pineda is a 52 y.o. male.  Patient presents with 4-day history of ear pain, sinus pressure, congestion, cough.  Treatment attempted with Claritin, Flonase, DayQuil.  No fever, rash, ear drainage, sore throat, shortness of breath, or other symptoms.  The history is provided by the patient and medical records.    Past Medical History:  Diagnosis Date   Achilles tendinitis    FH: pulmonary embolism    GERD (gastroesophageal reflux disease)    History of hiatal hernia     Patient Active Problem List   Diagnosis Date Noted   Tinea versicolor 05/01/2022   Headache 05/01/2022   Radicular pain in right arm 06/10/2020   PSA elevation 04/20/2017   Groin pain 08/23/2016   Routine general medical examination at a health care facility 05/11/2016   Advance care planning 05/11/2016   Fatigue 05/11/2016   FH: pulmonary embolism 05/11/2016   Achilles tendonitis 05/11/2016   Status post Nissen fundoplication Dec 2016 10/16/2015   ALLERGIC RHINITIS 08/18/2008    Past Surgical History:  Procedure Laterality Date   ESOPHAGEAL MANOMETRY N/A 08/03/2015   Procedure: ESOPHAGEAL MANOMETRY (EM);  Surgeon: Dorena Cookey, MD;  Location: [facility];  Service: Endoscopy;  Laterality: N/A;  Dr. Charlott Rakes to read.    HERNIA REPAIR Left 12/14/2021   inguinal   LAPAROSCOPIC NISSEN FUNDOPLICATION N/A 10/16/2015   Procedure: LAPAROSCOPIC NISSEN FUNDOPLICATION & HIATAL HERNIA REPAIR, upper endoscopy;  Surgeon: Luretha Murphy, MD;  Location: [facility];  Service: General;  Laterality: N/A;       Home Medications    Prior to Admission medications   Medication Sig Start Date End Date Taking? Authorizing Provider  amoxicillin (AMOXIL) 875  MG tablet Take 1 tablet (875 mg total) by mouth 2 (two) times daily for 10 days. 04/16/23 04/26/23 Yes Mickie Bail, NP  cetirizine (ZYRTEC ALLERGY) 10 MG tablet Take 10 mg by mouth daily as needed for allergies.    [provider]  fluticasone (FLONASE) 50 MCG/ACT nasal spray Place 2 sprays into both nostrils daily as needed for allergies or rhinitis.    [provider]  ibuprofen (ADVIL,MOTRIN) 200 MG tablet Take 400 mg by mouth every 6 (six) hours as needed (back pain).    [provider]    Family History Family History  Problem Relation Age of Onset   Pulmonary embolism Mother    Bladder Cancer Mother    Hypertension Father    Arthritis Father    Diabetes Father    CAD Father    Pulmonary embolism Other        mult distant family members with DVT/PE, but not his sibs   Colon cancer Neg Hx    Prostate cancer Neg Hx     Social History Social History   Tobacco Use   Smoking status: Former    Passive exposure: Past   Smokeless tobacco: Never  Substance Use Topics   Alcohol use: No    Alcohol/week: 0.0 standard drinks of alcohol   Drug use: No     Allergies   Mobic [meloxicam]   Review of Systems Review of Systems  Constitutional:  Negative for chills and fever.  HENT:  Positive for congestion, ear pain and sinus pressure. Negative for sore throat.   Respiratory:  Positive for cough. Negative for shortness of breath.   Gastrointestinal:  Negative for diarrhea and vomiting.  Skin:  Negative for rash.     Physical Exam Triage Vital Signs ED Triage Vitals  Enc Vitals Group     BP 04/16/23 1058 127/86     Pulse Rate 04/16/23 1035 76     Resp 04/16/23 1035 18     Temp 04/16/23 1035 98.2 F (36.8 C)     Temp src --      SpO2 04/16/23 1035 98 %     Weight --      Height --      Head Circumference --      Peak Flow --      Pain Score 04/16/23 1057 2     Pain Loc --      Pain Edu? --      Excl. in GC? --    No data  found.  Updated Vital Signs BP 127/86   Pulse 76   Temp 98.2 F (36.8 C)   Resp 18   SpO2 98%   Visual Acuity Right Eye Distance:   Left Eye Distance:   Bilateral Distance:    Right Eye Near:   Left Eye Near:    Bilateral Near:     Physical Exam Vitals and nursing note reviewed.  Constitutional:      General: He is not in acute distress.    Appearance: He is well-developed.  HENT:     Right Ear: Tympanic membrane is erythematous.     Left Ear: Tympanic membrane is erythematous.     Nose: Congestion present.     Mouth/Throat:     Mouth: Mucous membranes are moist.     Pharynx: Oropharynx is clear.  Cardiovascular:     Rate and Rhythm: Normal rate and regular rhythm.     Heart sounds: Normal heart sounds.  Pulmonary:     Effort: Pulmonary effort is normal. No respiratory distress.     Breath sounds: Normal breath sounds.  Musculoskeletal:     Cervical back: Neck supple.  Skin:    General: Skin is warm and dry.  Neurological:     Mental Status: He is alert.  Psychiatric:        Mood and Affect: Mood normal.        Behavior: Behavior normal.      UC Treatments / Results  Labs (all labs ordered are listed, but only abnormal results are displayed) Labs Reviewed - No data to display  EKG   Radiology No results found.  Procedures Procedures (including critical care time)  Medications Ordered in UC Medications - No data to display  Initial Impression / Assessment and Plan / UC Course  I have reviewed the triage vital signs and the nursing notes.  Pertinent labs & imaging results that were available during my care of the patient were reviewed by me and considered in my medical decision making (see chart for details).    Bilateral otitis media, acute sinusitis.  Treating with amoxicillin.  Tylenol or ibuprofen as needed.  Instructed patient to follow up with his PCP if his symptoms are not improving.  He agrees to plan of care.    Final Clinical  Impressions(s) / UC Diagnoses   Final diagnoses:  Bilateral otitis media, unspecified otitis media type  Acute non-recurrent maxillary sinusitis  Discharge Instructions      Take the amoxicillin as directed.  Follow up with your primary care provider if your symptoms are not improving.        ED Prescriptions     Medication Sig Dispense Auth. Provider   amoxicillin (AMOXIL) 875 MG tablet Take 1 tablet (875 mg total) by mouth 2 (two) times daily for 10 days. 20 tablet Mickie Bail, NP      PDMP not reviewed this encounter.   Mickie Bail, NP 04/16/23 1119

## 2023-04-16 NOTE — ED Triage Notes (Signed)
Patient to Urgent Care with complaints of bilateral ear fullness, sinus pain and pressure. Describes sharp/ stabbing pain in his right ear.   Symptoms started four days ago. Has been taking Claritin/ Flonase. Took dayquil this morning. Denies any fevers.

## 2023-04-16 NOTE — Discharge Instructions (Addendum)
Take the amoxicillin as directed.  Follow up with your primary care provider if your symptoms are not improving.   ° ° °

## 2023-04-24 ENCOUNTER — Other Ambulatory Visit (INDEPENDENT_AMBULATORY_CARE_PROVIDER_SITE_OTHER): Payer: 59

## 2023-04-24 DIAGNOSIS — Z8249 Family history of ischemic heart disease and other diseases of the circulatory system: Secondary | ICD-10-CM | POA: Diagnosis not present

## 2023-04-24 DIAGNOSIS — Z125 Encounter for screening for malignant neoplasm of prostate: Secondary | ICD-10-CM | POA: Diagnosis not present

## 2023-04-24 LAB — COMPREHENSIVE METABOLIC PANEL
ALT: 15 U/L (ref 0–53)
AST: 17 U/L (ref 0–37)
Albumin: 4.2 g/dL (ref 3.5–5.2)
Alkaline Phosphatase: 71 U/L (ref 39–117)
BUN: 13 mg/dL (ref 6–23)
CO2: 30 mEq/L (ref 19–32)
Calcium: 9.4 mg/dL (ref 8.4–10.5)
Chloride: 105 mEq/L (ref 96–112)
Creatinine, Ser: 0.85 mg/dL (ref 0.40–1.50)
GFR: 100.68 mL/min (ref >60.00)
Glucose, Bld: 94 mg/dL (ref 70–99)
Potassium: 4.3 mEq/L (ref 3.5–5.1)
Sodium: 140 mEq/L (ref 135–145)
Total Bilirubin: 0.7 mg/dL (ref 0.2–1.2)
Total Protein: 6.8 g/dL (ref 6.0–8.3)

## 2023-04-24 LAB — LIPID PANEL
Cholesterol: 180 mg/dL (ref 0–200)
HDL: 68.2 mg/dL (ref >39.00)
LDL Cholesterol: 102 mg/dL — ABNORMAL HIGH (ref 0–99)
NonHDL: 111.74
Total CHOL/HDL Ratio: 3
Triglycerides: 48 mg/dL (ref 0.0–149.0)
VLDL: 9.6 mg/dL (ref 0.0–40.0)

## 2023-04-24 LAB — PSA: PSA: 5.68 ng/mL — ABNORMAL HIGH (ref 0.10–4.00)

## 2023-05-01 ENCOUNTER — Ambulatory Visit (INDEPENDENT_AMBULATORY_CARE_PROVIDER_SITE_OTHER): Payer: 59 | Admitting: Family Medicine

## 2023-05-01 ENCOUNTER — Ambulatory Visit (INDEPENDENT_AMBULATORY_CARE_PROVIDER_SITE_OTHER)
Admission: RE | Admit: 2023-05-01 | Discharge: 2023-05-01 | Disposition: A | Discharge status: Home / Self Care | Payer: 59 | Source: Ambulatory Visit | Attending: Family Medicine | Admitting: Family Medicine

## 2023-05-01 ENCOUNTER — Encounter: Payer: Self-pay | Admitting: Family Medicine

## 2023-05-01 VITALS — BP 116/66 | HR 70 | Temp 98.7°F | Ht 71.0 in | Wt 189.0 lb

## 2023-05-01 DIAGNOSIS — M542 Cervicalgia: Secondary | ICD-10-CM

## 2023-05-01 DIAGNOSIS — Z0000 Encounter for general adult medical examination without abnormal findings: Secondary | ICD-10-CM

## 2023-05-01 DIAGNOSIS — Z7189 Other specified counseling: Secondary | ICD-10-CM

## 2023-05-01 DIAGNOSIS — R972 Elevated prostate specific antigen [PSA]: Secondary | ICD-10-CM

## 2023-05-01 NOTE — Progress Notes (Unsigned)
CPE- See plan.  Routine anticipatory guidance given to patient.  See health maintenance.  The possibility exists that previously documented standard health maintenance information may have been brought forward from a previous encounter into this note.  If needed, that same information has been updated to reflect the current situation based on today's encounter.    Tetanus 2023 Flu done yearly.   PNA not due.   Shingles d/w pt.  covid vaccine 2021 Living will d/w pt.  Wife designated if patient were incapacitated.   Diet and exercise d/w pt, doing well on both.  Colonoscopy due, d/w pt.   [facility] to prior- I will update Dr. Richardo Hanks.     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  He may be relocated to [place]  He has L heel pain with podiatry f/u pending    He saw dermatology about tinea.  I will defer.  He agrees.   Both sons are in college this year.  [employer] in [place].    Had been seeing chiropractor about his neck and that isn't helping now.  R lower neck pain, can get R fingertip numbness. No L arm sx but can radiate down into the L upper chest.  Neck films pending.    PMH and SH reviewed  Meds, vitals, and allergies reviewed.   ROS: Per HPI.  Unless specifically indicated otherwise in HPI, the patient denies:  General: fever. Eyes: acute vision changes ENT: sore throat Cardiovascular: chest pain Respiratory: SOB GI: vomiting GU: dysuria Musculoskeletal: acute back pain Derm: acute rash Neuro: acute motor dysfunction Psych: worsening mood Endocrine: polydipsia Heme: bleeding Allergy: hayfever  GEN: nad, alert and oriented HEENT: mucous membranes moist NECK: supple w/o LA CV: rrr. PULM: ctab, no inc wob ABD: soft, +bs EXT: no edema SKIN: no acute rash Normal strength and sensation in the upper extremities bilaterally.

## 2023-05-01 NOTE — Patient Instructions (Addendum)
Shingles shot when possible.  [place]  I'll check with urology.   Take care.  Glad to see you. Go to the lab on the way out.   If you have mychart we'll likely use that to update you.

## 2023-05-03 ENCOUNTER — Ambulatory Visit (INDEPENDENT_AMBULATORY_CARE_PROVIDER_SITE_OTHER): Payer: 59 | Admitting: Podiatry

## 2023-05-03 DIAGNOSIS — M542 Cervicalgia: Secondary | ICD-10-CM | POA: Insufficient documentation

## 2023-05-03 DIAGNOSIS — M722 Plantar fascial fibromatosis: Secondary | ICD-10-CM | POA: Diagnosis not present

## 2023-05-03 DIAGNOSIS — M62461 Contracture of muscle, right lower leg: Secondary | ICD-10-CM

## 2023-05-03 NOTE — Assessment & Plan Note (Signed)
Living will d/w pt.  Wife designated if patient were incapacitated.   ?

## 2023-05-03 NOTE — Progress Notes (Signed)
  Subjective:  Patient ID: Jose Pineda, male    DOB: 09-15-1971,  MRN: [id number]  Chief Complaint  Patient presents with   Plantar Fasciitis    Left heel pain     52 y.o. male presents with the above complaint.  Patient presents with complaint of right heel pain that has been going for quite some time is progressive gotten worse worse with ambulation worse with pressure when to get it evaluated has not seen anyone as prior to seeing me.  He would like to discuss treatment options pain scale 7 out of 10 dull aching nature hurts with taking for step in the morning   Review of Systems: Negative except as noted in the HPI. Denies N/V/F/Ch.  Past Medical History:  Diagnosis Date   Achilles tendinitis    FH: pulmonary embolism    GERD (gastroesophageal reflux disease)    History of hiatal hernia     Current Outpatient Medications:    cetirizine (ZYRTEC ALLERGY) 10 MG tablet, Take 10 mg by mouth daily as needed for allergies., Disp: , Rfl:    fluticasone (FLONASE) 50 MCG/ACT nasal spray, Place 2 sprays into both nostrils daily as needed for allergies or rhinitis., Disp: , Rfl:    ibuprofen (ADVIL,MOTRIN) 200 MG tablet, Take 400 mg by mouth every 6 (six) hours as needed (back pain)., Disp: , Rfl:   Social History   Tobacco Use  Smoking Status Former   Current packs/day: 0.00   Average packs/day: 0.5 packs/day for 5.0 years (2.5 ttl pk-yrs)   Types: Cigarettes   Start date: 09/07/1996   Quit date: 09/07/2001   Years since quitting: 21.7   Passive exposure: Past  Smokeless Tobacco Never    Allergies  Allergen Reactions   Mobic [Meloxicam] Other (See Comments)    Heartburn    Objective:  There were no vitals filed for this visit. There is no height or weight on file to calculate BMI. Constitutional Well developed. Well nourished.  Vascular Dorsalis pedis pulses palpable bilaterally. Posterior tibial pulses palpable bilaterally. Capillary refill normal to all digits.  No  cyanosis or clubbing noted. Pedal hair growth normal.  Neurologic Normal speech. Oriented to person, place, and time. Epicritic sensation to light touch grossly present bilaterally.  Dermatologic Nails well groomed and normal in appearance. No open wounds. No skin lesions.  Orthopedic: Normal joint ROM without pain or crepitus bilaterally. No visible deformities. Tender to palpation at the calcaneal tuber right. No pain with calcaneal squeeze right. Ankle ROM diminished range of motion right. Silf[place]old Test: positive right.   Radiographs: None  Assessment:   1. Plantar fasciitis of right foot   2. Gastrocnemius equinus, right    Plan:  Patient was evaluated and treated and all questions answered.  Plantar Fasciitis, right with underlying gastrocnemius equinus - XR reviewed as above.  - Educated on icing and stretching. Instructions given.  - Injection delivered to the plantar fascia as below. - DME: Plantar fascial brace dispensed to support the medial longitudinal arch of the foot and offload pressure from the heel and prevent arch collapse during weightbearing - Pharmacologic management: None  Procedure: Injection Tendon/Ligament Location: Right plantar fascia at the glabrous junction; medial approach. Skin Prep: alcohol Injectate: 0.5 cc 0.5% marcaine plain, 0.5 cc of 1% Lidocaine, 0.5 cc kenalog 10. Disposition: Patient tolerated procedure well. Injection site dressed with a band-aid.  No follow-ups on file.

## 2023-05-03 NOTE — Assessment & Plan Note (Addendum)
Neck is not stiff on exam.  He has normal strength and sensation.  See notes on plain films.

## 2023-05-03 NOTE — Assessment & Plan Note (Signed)
Tetanus 2023 Flu done yearly.   PNA not due.   Shingles d/w pt.  covid vaccine 2021 Living will d/w pt.  Wife designated if patient were incapacitated.   Diet and exercise d/w pt, doing well on both.  Colonoscopy due, d/w pt.   [facility] PSA similar to prior- I will update Dr. Richardo Hanks.

## 2023-05-03 NOTE — Assessment & Plan Note (Signed)
PSA similar to prior- I will update Dr. Richardo Hanks.

## 2023-06-01 ENCOUNTER — Ambulatory Visit (INDEPENDENT_AMBULATORY_CARE_PROVIDER_SITE_OTHER): Payer: 59 | Admitting: Podiatry

## 2023-06-01 DIAGNOSIS — M722 Plantar fascial fibromatosis: Secondary | ICD-10-CM | POA: Diagnosis not present

## 2023-06-01 DIAGNOSIS — M62461 Contracture of muscle, right lower leg: Secondary | ICD-10-CM | POA: Diagnosis not present

## 2023-06-01 MED ORDER — METHYLPREDNISOLONE 4 MG PO TBPK: ORAL_TABLET | ORAL | 0 refills | Status: DC

## 2023-06-01 MED ORDER — MELOXICAM 15 MG PO TABS: 15.0000 mg | ORAL_TABLET | Freq: Every day | ORAL | 0 refills | Status: DC

## 2023-06-01 NOTE — Progress Notes (Signed)
  Subjective:  Patient ID: Jose Pineda, male    DOB: 1971-09-30,  MRN: [id number]  Chief Complaint  Patient presents with   Plantar Fasciitis    Pt stated that some days are better than others     52 y.o. male presents with the above complaint.  Presents for follow-up of Planter fasciitis.  He says he is doing well with better but injection not helped much.  Denies any other acute complaints would like to do both injection and at this time.   Review of Systems: Negative except as noted in the HPI. Denies N/V/F/Ch.  Past Medical History:  Diagnosis Date   Achilles tendinitis    FH: pulmonary embolism    GERD (gastroesophageal reflux disease)    History of hiatal hernia     Current Outpatient Medications:    meloxicam (MOBIC) 15 MG tablet, Take 1 tablet (15 mg total) by mouth daily., Disp: 30 tablet, Rfl: 0   methylPREDNISolone (MEDROL DOSEPAK) 4 MG TBPK tablet, Take as directed, Disp: 21 each, Rfl: 0   cetirizine (ZYRTEC ALLERGY) 10 MG tablet, Take 10 mg by mouth daily as needed for allergies., Disp: , Rfl:    fluticasone (FLONASE) 50 MCG/ACT nasal spray, Place 2 sprays into both nostrils daily as needed for allergies or rhinitis., Disp: , Rfl:    ibuprofen (ADVIL,MOTRIN) 200 MG tablet, Take 400 mg by mouth every 6 (six) hours as needed (back pain)., Disp: , Rfl:   Social History   Tobacco Use  Smoking Status Former   Current packs/day: 0.00   Average packs/day: 0.5 packs/day for 5.0 years (2.5 ttl pk-yrs)   Types: Cigarettes   Start date: 09/07/1996   Quit date: 09/07/2001   Years since quitting: 21.7   Passive exposure: Past  Smokeless Tobacco Never    Allergies  Allergen Reactions   Mobic [Meloxicam] Other (See Comments)    Heartburn    Objective:  There were no vitals filed for this visit. There is no height or weight on file to calculate BMI. Constitutional Well developed. Well nourished.  Vascular Dorsalis pedis pulses palpable bilaterally. Posterior  tibial pulses palpable bilaterally. Capillary refill normal to all digits.  No cyanosis or clubbing noted. Pedal hair growth normal.  Neurologic Normal speech. Oriented to person, place, and time. Epicritic sensation to light touch grossly present bilaterally.  Dermatologic Nails well groomed and normal in appearance. No open wounds. No skin lesions.  Orthopedic: Normal joint ROM without pain or crepitus bilaterally. No visible deformities. Tender to palpation at the calcaneal tuber right. No pain with calcaneal squeeze right. Ankle ROM diminished range of motion right. Silfverskiold Test: positive right.   Radiographs: None  Assessment:   1. Plantar fasciitis of right foot   2. Gastrocnemius equinus, right     Plan:  Patient was evaluated and treated and all questions answered.  Plantar Fasciitis, right with underlying gastrocnemius equinus - XR reviewed as above.  - Educated on icing and stretching. Instructions given.  -Second injection delivered to the plantar fascia as below. - DME: Cam boot was also dispensed to completely mobilize [place] - Pharmacologic management: No Mobic  Procedure: Injection Tendon/Ligament Location: Right plantar fascia at the glabrous junction; medial approach. Skin Prep: alcohol Injectate: 0.5 cc 0.5% marcaine plain, 0.5 cc of 1% Lidocaine, 0.5 cc kenalog 10. Disposition: Patient tolerated procedure well. Injection site dressed with a band-aid.  No follow-ups on file.

## 2024-02-25 ENCOUNTER — Encounter: Payer: Self-pay | Admitting: Family Medicine

## 2024-02-29 ENCOUNTER — Encounter: Payer: Self-pay | Admitting: Gastroenterology

## 2024-02-29 ENCOUNTER — Other Ambulatory Visit: Payer: Self-pay | Admitting: Family Medicine

## 2024-02-29 DIAGNOSIS — Z1211 Encounter for screening for malignant neoplasm of colon: Secondary | ICD-10-CM

## 2024-04-15 ENCOUNTER — Encounter: Payer: Self-pay | Admitting: Gastroenterology

## 2024-04-15 ENCOUNTER — Ambulatory Visit (AMBULATORY_SURGERY_CENTER): Admitting: *Deleted

## 2024-04-15 VITALS — Ht 71.0 in | Wt 185.0 lb

## 2024-04-15 DIAGNOSIS — Z1211 Encounter for screening for malignant neoplasm of colon: Secondary | ICD-10-CM

## 2024-04-15 MED ORDER — NA SULFATE-K SULFATE-MG SULF 17.5-3.13-1.6 GM/177ML PO SOLN: 1.0000 | Freq: Once | ORAL | 0 refills | Status: AC

## 2024-04-15 NOTE — Progress Notes (Signed)
 Pt's name and DOB verified at the beginning of the pre-visit wit 2 identifiers  Pt denies any difficulty with ambulating,sitting, laying down or rolling side to side  Pt has no issues moving head neck or swallowing  No egg or soy allergy known to patient   No issues known to pt with past sedation with any surgeries or procedures  No FH of Malignant Hyperthermia  Pt is not on home 02   Pt is not on blood thinners   Pt denies issues with constipation   Pt has frequent issues with constipation RN instructed pt to use Miralax per bottles instructions a week before prep days. Pt states they will  Pt is not on dialysis  Pt denise any abnormal heart rhythms   Pt denies any upcoming cardiac testing  Patient's chart reviewed by Rogena Class CNRA prior to pre-visit and patient appropriate for the [facility].  Pre-visit completed and red dot placed by patient's name on their procedure day (on provider's schedule).    Visit by phone  Pt states weight is 185 lb   IInstructions reviewed. Pt given  both [facility] main # and MD on call # prior to instructions.  Pt states understanding of instructions. Instructed pt to review instructions again prior to procedure and call main # given if has questions.. Pt states they will.   Instructed pt on where to find instructions on My Chart.

## 2024-04-22 ENCOUNTER — Ambulatory Visit: Admitting: Gastroenterology

## 2024-04-22 ENCOUNTER — Encounter: Payer: Self-pay | Admitting: Gastroenterology

## 2024-04-22 VITALS — BP 115/69 | HR 58 | Temp 98.3°F | Resp 21 | Ht 71.0 in | Wt 185.0 lb

## 2024-04-22 DIAGNOSIS — K5730 Diverticulosis of large intestine without perforation or abscess without bleeding: Secondary | ICD-10-CM | POA: Diagnosis not present

## 2024-04-22 DIAGNOSIS — K635 Polyp of colon: Secondary | ICD-10-CM

## 2024-04-22 DIAGNOSIS — Z1211 Encounter for screening for malignant neoplasm of colon: Secondary | ICD-10-CM

## 2024-04-22 DIAGNOSIS — D123 Benign neoplasm of transverse colon: Secondary | ICD-10-CM

## 2024-04-22 MED ORDER — SODIUM CHLORIDE 0.9 % IV SOLN: 500.0000 mL | Freq: Once | INTRAVENOUS | Status: DC

## 2024-04-22 NOTE — Progress Notes (Signed)
 [facility] History and Physical   Primary Care Physician:  Cleatus Arlyss RAMAN, MD   Reason for Procedure:   Colon cancer screening  Plan:    Screening colonoscopy     HPI: Jose Pineda is a 53 y.o. male undergoing initial average risk screening colonoscopy.  He has no family history of colon cancer and no chronic GI symptoms.    Past Medical History:  Diagnosis Date   Achilles tendinitis    FH: pulmonary embolism    GERD (gastroesophageal reflux disease)    History of hiatal hernia    Inguinal hernia     Past Surgical History:  Procedure Laterality Date   ESOPHAGEAL MANOMETRY N/A 08/03/2015   Procedure: ESOPHAGEAL MANOMETRY (EM);  Surgeon: Norleen Hint, MD;  Location: [facility];  Service: Endoscopy;  Laterality: N/A;  Dr. Jerrell Sol to read.    HERNIA REPAIR Left 12/14/2021   inguinal   INGUINAL HERNIA REPAIR     LAPAROSCOPIC NISSEN FUNDOPLICATION N/A 10/16/2015   Procedure: LAPAROSCOPIC NISSEN FUNDOPLICATION & HIATAL HERNIA REPAIR, upper endoscopy;  Surgeon: Donnice Lunger, MD;  Location: [facility];  Service: General;  Laterality: N/A;    Prior to Admission medications   Medication Sig Start Date End Date Taking? Authorizing Provider  ibuprofen (ADVIL,MOTRIN) 200 MG tablet Take 400 mg by mouth every 6 (six) hours as needed (back pain).   Yes [provider]  Zinc 50 MG TABS Take by mouth daily at 6 (six) AM.   Yes [provider]  cetirizine (ZYRTEC ALLERGY) 10 MG tablet Take 10 mg by mouth daily as needed for allergies.    [provider]  fluticasone (FLONASE) 50 MCG/ACT nasal spray Place 2 sprays into both nostrils daily as needed for allergies or rhinitis.    [provider]  meloxicam  (MOBIC ) 15 MG tablet Take 1 tablet (15 mg total) by mouth daily. Patient not taking: Reported on 04/22/2024 06/01/23   Tobie Franky SQUIBB, DPM  methylPREDNISolone  (MEDROL  DOSEPAK) 4 MG TBPK tablet Take as directed Patient not taking:  Reported on 04/22/2024 06/01/23   Tobie Franky SQUIBB, DPM    Current Outpatient Medications  Medication Sig Dispense Refill   ibuprofen (ADVIL,MOTRIN) 200 MG tablet Take 400 mg by mouth every 6 (six) hours as needed (back pain).     Zinc 50 MG TABS Take by mouth daily at 6 (six) AM.     cetirizine (ZYRTEC ALLERGY) 10 MG tablet Take 10 mg by mouth daily as needed for allergies.     fluticasone (FLONASE) 50 MCG/ACT nasal spray Place 2 sprays into both nostrils daily as needed for allergies or rhinitis.     meloxicam  (MOBIC ) 15 MG tablet Take 1 tablet (15 mg total) by mouth daily. (Patient not taking: Reported on 04/22/2024) 30 tablet 0   methylPREDNISolone  (MEDROL  DOSEPAK) 4 MG TBPK tablet Take as directed (Patient not taking: Reported on 04/22/2024) 21 each 0   Current Facility-Administered Medications  Medication Dose Route Frequency Provider Last Rate Last Admin   0.9 %  sodium chloride  infusion  500 mL Intravenous Once Stacia Glendia BRAVO, MD        Allergies as of 04/22/2024 - Review Complete 04/22/2024  Allergen Reaction Noted   Mobic  [meloxicam ] Other (See Comments) 04/28/2015    Family History  Problem Relation Age of Onset   Pulmonary embolism Mother    Bladder Cancer Mother    Hypertension Father    Arthritis Father    Diabetes Father    CAD  Father    Pulmonary embolism Other        mult distant family members with DVT/PE, but not his sibs   Colon cancer Neg Hx    Prostate cancer Neg Hx    Colon polyps Neg Hx    Esophageal cancer Neg Hx    Stomach cancer Neg Hx    Rectal cancer Neg Hx     Social History   Socioeconomic History   Marital status: Married    Spouse name: Not on file   Number of children: Not on file   Years of education: Not on file   Highest education level: Not on file  Occupational History   Not on file  Tobacco Use   Smoking status: Former    Current packs/day: 0.00    Average packs/day: 0.5 packs/day for 5.0 years (2.5 ttl pk-yrs)    Types:  Cigarettes    Start date: 09/07/1996    Quit date: 09/07/2001    Years since quitting: 22.6    Passive exposure: Past   Smokeless tobacco: Never  Vaping Use   Vaping status: Never Used  Substance and Sexual Activity   Alcohol use: No   Drug use: No   Sexual activity: Yes    Birth control/protection: None  Other Topics Concern   Not on file  Social History Narrative   Married 2000   2 sons   [employer], broadcasting/film/video- PE, elementary.  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     Social Drivers of Corporate investment banker Strain: Not on file  Food Insecurity: Not on file  Transportation Needs: Not on file  Physical Activity: Not on file  Stress: Not on file  Social Connections: Not on file  Intimate Partner Violence: Not on file    Review of Systems:  All other review of systems negative except as mentioned in the HPI.  Physical Exam: Vital signs BP 128/66   Pulse 69   Temp 98.3 F (36.8 C) (Skin)   Ht 5' 11 (1.803 m)   Wt 185 lb (83.9 kg)   SpO2 100%   BMI 25.80 kg/m   General:   Alert,  Well-developed, well-nourished, pleasant and cooperative in NAD Airway:  Mallampati 2 Lungs:  Clear throughout to auscultation.   Heart:  Regular rate and rhythm; no murmurs, clicks, rubs,  or gallops. Abdomen:  Soft, nontender and nondistended. Normal bowel sounds.   Neuro/Psych:  Normal mood and affect. A and O x 3   Milka Windholz E. Stacia, MD [facility]

## 2024-04-22 NOTE — Progress Notes (Signed)
 Called to room to assist during endoscopic procedure.  Patient ID and intended procedure confirmed with present staff. Received instructions for my participation in the procedure from the performing physician.

## 2024-04-22 NOTE — Op Note (Signed)
 [facility] Patient Name: Jose Pineda Procedure Date: 04/22/2024 1:56 PM MRN: [id number] Endoscopist: Glendia E. Stacia , MD, [id number] Age: 53 Referring MD:  Date of Birth: 1971-04-29 Gender: Male Account #: [id number] Procedure:                Colonoscopy Indications:              Screening for colorectal malignant neoplasm, This                            is the patient's first colonoscopy Medicines:                Monitored Anesthesia Care Procedure:                Pre-Anesthesia Assessment:                           - Prior to the procedure, a History and Physical                            was performed, and patient medications and                            allergies were reviewed. The patient's tolerance of                            previous anesthesia was also reviewed. The risks                            and benefits of the procedure and the sedation                            options and risks were discussed with the patient.                            All questions were answered, and informed consent                            was obtained. Prior Anticoagulants: The patient has                            taken no anticoagulant or antiplatelet agents. ASA                            Grade Assessment: II - A patient with mild systemic                            disease. After reviewing the risks and benefits,                            the patient was deemed in satisfactory condition to                            undergo the procedure.  After obtaining informed consent, the colonoscope                            was passed under direct vision. Throughout the                            procedure, the patient's blood pressure, pulse, and                            oxygen saturations were monitored continuously. The                            CF [id number] #[id number] was introduced through the anus                            and advanced to the  the terminal ileum, with                            identification of the appendiceal orifice and IC                            valve. The colonoscopy was performed without                            difficulty. The patient tolerated the procedure                            well. The quality of the bowel preparation was                            good. The terminal ileum, ileocecal valve,                            appendiceal orifice, and rectum were photographed.                            The bowel preparation used was SUPREP via split                            dose instruction. Scope In: 2:11:21 PM Scope Out: 2:31:03 PM Scope Withdrawal Time: 0 hours 12 minutes 39 seconds  Total Procedure Duration: 0 hours 19 minutes 42 seconds  Findings:                 The perianal and digital rectal examinations were                            normal. Pertinent negatives include normal                            sphincter tone and no palpable rectal lesions.                           A 3 mm polyp was found in the transverse colon. The  polyp was sessile. The polyp was removed with a                            cold snare. Resection and retrieval were complete.                            Estimated blood loss was minimal.                           Multiple medium-mouthed and small-mouthed                            diverticula were found in the sigmoid colon.                           The exam was otherwise normal throughout the                            examined colon.                           The terminal ileum appeared normal.                           The retroflexed view of the distal rectum and anal                            verge was normal and showed no anal or rectal                            abnormalities. Complications:            No immediate complications. Estimated Blood Loss:     Estimated blood loss was minimal. Impression:               - One 3 mm  polyp in the transverse colon, removed                            with a cold snare. Resected and retrieved.                           - Mild diverticulosis in the sigmoid colon.                           - The examined portion of the ileum was normal.                           - The distal rectum and anal verge are normal on                            retroflexion view. Recommendation:           - Patient has a contact number available for                            emergencies. The signs  and symptoms of potential                            delayed complications were discussed with the                            patient. Return to normal activities tomorrow.                            Written discharge instructions were provided to the                            patient.                           - Resume previous diet.                           - Continue present medications.                           - Await pathology results.                           - Repeat colonoscopy (date not yet determined) for                            surveillance based on pathology results. Ashrita Chrismer E. Stacia, MD 04/22/2024 2:37:35 PM This report has been signed electronically.

## 2024-04-22 NOTE — Patient Instructions (Signed)
 Resume previous diet and medications. Awaiting pathology results. Repeat Colonoscopy date to be determined based on pathology results. Handout provided on colon polyps and diverticulosis  YOU HAD AN ENDOSCOPIC PROCEDURE TODAY AT THE [facility]:   Refer to the procedure report that was given to you for any specific questions about what was found during the examination.  If the procedure report does not answer your questions, please call your gastroenterologist to clarify.  If you requested that your care partner not be given the details of your procedure findings, then the procedure report has been included in a sealed envelope for you to review at your convenience later.  YOU SHOULD EXPECT: Some feelings of bloating in the abdomen. Passage of more gas than usual.  Walking can help get rid of the air that was put into your GI tract during the procedure and reduce the bloating. If you had a lower endoscopy (such as a colonoscopy or flexible sigmoidoscopy) you may notice spotting of blood in your stool or on the toilet paper. If you underwent a bowel prep for your procedure, you may not have a normal bowel movement for a few days.  Please Note:  You might notice some irritation and congestion in your nose or some drainage.  This is from the oxygen used during your procedure.  There is no need for concern and it should clear up in a day or so.  SYMPTOMS TO REPORT IMMEDIATELY:  Following lower endoscopy (colonoscopy or flexible sigmoidoscopy):  Excessive amounts of blood in the stool  Significant tenderness or worsening of abdominal pains  Swelling of the abdomen that is new, acute  Fever of 100F or higher  For urgent or emergent issues, a gastroenterologist can be reached at any hour by calling [phone]. Do not use MyChart messaging for urgent concerns.    DIET:  We do recommend a small meal at first, but then you may proceed to your regular diet.  Drink plenty of fluids but you  should avoid alcoholic beverages for 24 hours.  ACTIVITY:  You should plan to take it easy for the rest of today and you should NOT DRIVE or use heavy machinery until tomorrow (because of the sedation medicines used during the test).    FOLLOW UP: Our staff will call the number listed on your records the next business day following your procedure.  We will call around 7:15- 8:00 am to check on you and address any questions or concerns that you may have regarding the information given to you following your procedure. If we do not reach you, we will leave a message.     If any biopsies were taken you will be contacted by phone or by letter within the next 1-3 weeks.  Please call us  at [phone] if you have not heard about the biopsies in 3 weeks.    SIGNATURES/CONFIDENTIALITY: You and/or your care partner have signed paperwork which will be entered into your electronic medical record.  These signatures attest to the fact that that the information above on your After Visit Summary has been reviewed and is understood.  Full responsibility of the confidentiality of this discharge information lies with you and/or your care-partner.

## 2024-04-22 NOTE — Progress Notes (Signed)
 VS by DT  Pt's states no medical or surgical changes since previsit or office visit.

## 2024-04-22 NOTE — Progress Notes (Signed)
 Sedate, gd SR, tolerated procedure well, VSS, report to RN

## 2024-04-23 ENCOUNTER — Telehealth: Payer: Self-pay

## 2024-04-23 NOTE — Telephone Encounter (Signed)
  Follow up Call-     04/22/2024    1:33 PM  Call back number  Post procedure Call Back phone  # [phone]  Permission to leave phone message Yes     Patient questions:  Do you have a fever, pain , or abdominal swelling? No. Pain Score  0 *  Have you tolerated food without any problems? Yes.    Have you been able to return to your normal activities? Yes.    Do you have any questions about your discharge instructions: Diet   No. Medications  No. Follow up visit  No.  Do you have questions or concerns about your Care? No.  Actions: * If pain score is 4 or above: No action needed, pain <4.

## 2024-04-25 ENCOUNTER — Ambulatory Visit: Payer: Self-pay | Admitting: Gastroenterology

## 2024-04-25 LAB — SURGICAL PATHOLOGY

## 2024-04-25 NOTE — Progress Notes (Signed)
 Mr. Say,  Good news: the polyp that I removed during your recent examination was NOT precancerous.  You should continue to follow current colorectal cancer screening guidelines with a repeat colonoscopy in 10 years.    If you develop any new rectal bleeding, abdominal pain or significant bowel habit changes, please contact me before then.

## 2024-05-02 ENCOUNTER — Ambulatory Visit (INDEPENDENT_AMBULATORY_CARE_PROVIDER_SITE_OTHER)
Admission: RE | Admit: 2024-05-02 | Discharge: 2024-05-02 | Disposition: A | Discharge status: Home / Self Care | Source: Ambulatory Visit | Attending: Family Medicine | Admitting: Family Medicine

## 2024-05-02 ENCOUNTER — Encounter: Payer: Self-pay | Admitting: Family Medicine

## 2024-05-02 ENCOUNTER — Ambulatory Visit (INDEPENDENT_AMBULATORY_CARE_PROVIDER_SITE_OTHER): Admitting: Family Medicine

## 2024-05-02 VITALS — BP 134/86 | HR 59 | Temp 97.9°F | Ht 70.5 in | Wt 188.8 lb

## 2024-05-02 DIAGNOSIS — Z8249 Family history of ischemic heart disease and other diseases of the circulatory system: Secondary | ICD-10-CM | POA: Diagnosis not present

## 2024-05-02 DIAGNOSIS — M25521 Pain in right elbow: Secondary | ICD-10-CM | POA: Diagnosis not present

## 2024-05-02 DIAGNOSIS — Z125 Encounter for screening for malignant neoplasm of prostate: Secondary | ICD-10-CM | POA: Diagnosis not present

## 2024-05-02 DIAGNOSIS — M25562 Pain in left knee: Secondary | ICD-10-CM

## 2024-05-02 DIAGNOSIS — Z7189 Other specified counseling: Secondary | ICD-10-CM

## 2024-05-02 DIAGNOSIS — Z0000 Encounter for general adult medical examination without abnormal findings: Secondary | ICD-10-CM

## 2024-05-02 DIAGNOSIS — M722 Plantar fascial fibromatosis: Secondary | ICD-10-CM

## 2024-05-02 DIAGNOSIS — M25529 Pain in unspecified elbow: Secondary | ICD-10-CM

## 2024-05-02 LAB — COMPREHENSIVE METABOLIC PANEL WITH GFR
ALT: 17 U/L (ref 0–53)
AST: 15 U/L (ref 0–37)
Albumin: 4.7 g/dL (ref 3.5–5.2)
Alkaline Phosphatase: 66 U/L (ref 39–117)
BUN: 19 mg/dL (ref 6–23)
CO2: 31 meq/L (ref 19–32)
Calcium: 9.7 mg/dL (ref 8.4–10.5)
Chloride: 105 meq/L (ref 96–112)
Creatinine, Ser: 0.88 mg/dL (ref 0.40–1.50)
GFR: 98.92 mL/min (ref >60.00)
Glucose, Bld: 89 mg/dL (ref 70–99)
Potassium: 4.5 meq/L (ref 3.5–5.1)
Sodium: 140 meq/L (ref 135–145)
Total Bilirubin: 0.8 mg/dL (ref 0.2–1.2)
Total Protein: 7.4 g/dL (ref 6.0–8.3)

## 2024-05-02 LAB — LIPID PANEL
Cholesterol: 204 mg/dL — ABNORMAL HIGH (ref 0–200)
HDL: 85.8 mg/dL (ref >39.00)
LDL Cholesterol: 108 mg/dL — ABNORMAL HIGH (ref 0–99)
NonHDL: 118.62
Total CHOL/HDL Ratio: 2
Triglycerides: 53 mg/dL (ref 0.0–149.0)
VLDL: 10.6 mg/dL (ref 0.0–40.0)

## 2024-05-02 LAB — PSA: PSA: 5.88 ng/mL — ABNORMAL HIGH (ref 0.10–4.00)

## 2024-05-02 NOTE — Patient Instructions (Addendum)
 Go to the lab on the way out.   If you have mychart we'll likely use that to update you.   Labs and xrays.   Take care.  Glad to see you. I would get a flu shot each fall.   Check with your insurance to see if they will cover the shingles shot. Please call about seeing podiatry.

## 2024-05-02 NOTE — Progress Notes (Signed)
 CPE- See plan.  Routine anticipatory guidance given to patient.  See health maintenance.  The possibility exists that previously documented standard health maintenance information may have been brought forward from a previous encounter into this note.  If needed, that same information has been updated to reflect the current situation based on today's encounter.    Tetanus 2023 Flu done yearly.   PNA d/w pt.   Shingles d/w pt.  covid vaccine 2021 Living will d/w pt.  Wife designated if patient were incapacitated.   Diet and exercise d/w pt, doing well on both.  [facility] 2025 [facility] PSA pending- I will update Dr. Francisca.    FH CAD.  No SOB.  No CP.  Labs pending.   Knee and elbow elbow pain.  R handed.  Some better with ibuprofen, temporary relief.  R elbow feels weaker due to pain- not paralysis.  Stiffness in the joints.  L>R knee pain.  No trauma.    L plantar fasciitis, with a knot in the bottom of the L foot, that keeps him from walking barefoot.  Had to wear a boot last year, saw podiatry.    PMH and SH reviewed  Meds, vitals, and allergies reviewed.   ROS: Per HPI.  Unless specifically indicated otherwise in HPI, the patient denies:  General: fever. Eyes: acute vision changes ENT: sore throat Cardiovascular: chest pain Respiratory: SOB GI: vomiting GU: dysuria Musculoskeletal: acute back pain Derm: acute rash Neuro: acute motor dysfunction Psych: worsening mood Endocrine: polydipsia Heme: bleeding Allergy: hayfever  GEN: nad, alert and oriented HEENT: ncat NECK: supple w/o LA CV: rrr. PULM: ctab, no inc wob ABD: soft, +bs EXT: no edema SKIN: no acute rash  Normal R elbow ROM, not ttp med/lat but faint crepitus on ROM.   L knee feels stable and not ttp on the joint line.  No obvious crepitus. L proximal plantar area ttp medial to the origin of plantar fascia.

## 2024-05-05 ENCOUNTER — Ambulatory Visit: Payer: Self-pay | Admitting: Family Medicine

## 2024-05-05 DIAGNOSIS — Z8249 Family history of ischemic heart disease and other diseases of the circulatory system: Secondary | ICD-10-CM | POA: Insufficient documentation

## 2024-05-05 DIAGNOSIS — M722 Plantar fascial fibromatosis: Secondary | ICD-10-CM | POA: Insufficient documentation

## 2024-05-05 DIAGNOSIS — M25529 Pain in unspecified elbow: Secondary | ICD-10-CM | POA: Insufficient documentation

## 2024-05-05 DIAGNOSIS — M25562 Pain in left knee: Secondary | ICD-10-CM | POA: Insufficient documentation

## 2024-05-05 NOTE — Assessment & Plan Note (Signed)
 My concern is that he has nodular thickening in the plantar fascia and I would like him to check with podiatry while he is still stateside, before going back to work in [place].  He said he would call about follow-up.

## 2024-05-05 NOTE — Assessment & Plan Note (Signed)
 See notes on imaging.  Given his recurrent pain it would be reasonable to check plain films.

## 2024-05-05 NOTE — Assessment & Plan Note (Signed)
 See notes on imaging.  He does not have obvious abnormality on exam but given his recurrent pain it would be reasonable to check plain films.

## 2024-05-05 NOTE — Assessment & Plan Note (Signed)
 Tetanus 2023 Flu done yearly.   PNA d/w pt.   Shingles d/w pt.  covid vaccine 2021 Living will d/w pt.  Wife designated if patient were incapacitated.   Diet and exercise d/w pt, doing well on both.  Col[facility]oscopy 2025 [facility]. PSA pending- I will update Dr. Francisca.

## 2024-05-05 NOTE — Assessment & Plan Note (Signed)
 No SOB.  No CP.  Labs pending.

## 2024-05-05 NOTE — Assessment & Plan Note (Signed)
 Living will d/w pt.  Wife designated if patient were incapacitated.   ?

## 2024-05-14 ENCOUNTER — Ambulatory Visit (INDEPENDENT_AMBULATORY_CARE_PROVIDER_SITE_OTHER): Admitting: Podiatry

## 2024-05-14 DIAGNOSIS — M62461 Contracture of muscle, right lower leg: Secondary | ICD-10-CM

## 2024-05-14 DIAGNOSIS — M722 Plantar fascial fibromatosis: Secondary | ICD-10-CM | POA: Diagnosis not present

## 2024-05-14 DIAGNOSIS — M21962 Unspecified acquired deformity of left lower leg: Secondary | ICD-10-CM

## 2024-05-14 DIAGNOSIS — M21961 Unspecified acquired deformity of right lower leg: Secondary | ICD-10-CM | POA: Diagnosis not present

## 2024-05-14 NOTE — Progress Notes (Signed)
 Subjective:  Patient ID: Jose Pineda, male    DOB: 1971-10-18,  MRN: [id number]  Chief Complaint  Patient presents with   Plantar Fasciitis    Pt stated that the pain from the plantar fasciitis is doing better he stated that he is not able to walk around the house without shoes on, he stated that it does feel like he has a knot in the heel     53 y.o. male presents with the above complaint.  Presents for follow-up of plantar fasciitis he states he is doing better he is able to manage it denies any other acute complaints he does feel little when he is going barefooted.   Review of Systems: Negative except as noted in the HPI. Denies N/V/F/Ch.  Past Medical History:  Diagnosis Date   Achilles tendinitis    FH: pulmonary embolism    GERD (gastroesophageal reflux disease)    History of hiatal hernia    Inguinal hernia     Current Outpatient Medications:    cetirizine (ZYRTEC ALLERGY) 10 MG tablet, Take 10 mg by mouth daily as needed for allergies., Disp: , Rfl:    fluticasone (FLONASE) 50 MCG/ACT nasal spray, Place 2 sprays into both nostrils daily as needed for allergies or rhinitis., Disp: , Rfl:    ibuprofen (ADVIL,MOTRIN) 200 MG tablet, Take 400 mg by mouth every 6 (six) hours as needed (back pain)., Disp: , Rfl:    Zinc 50 MG TABS, Take by mouth daily at 6 (six) AM., Disp: , Rfl:   Social History   Tobacco Use  Smoking Status Former   Current packs/day: 0.00   Average packs/day: 0.5 packs/day for 5.0 years (2.5 ttl pk-yrs)   Types: Cigarettes   Start date: 09/07/1996   Quit date: 09/07/2001   Years since quitting: 22.6   Passive exposure: Past  Smokeless Tobacco Never    Allergies  Allergen Reactions   Mobic  [Meloxicam ] Other (See Comments)    Heartburn    Objective:  There were no vitals filed for this visit. There is no height or weight on file to calculate BMI. Constitutional Well developed. Well nourished.  Vascular Dorsalis pedis pulses palpable  bilaterally. Posterior tibial pulses palpable bilaterally. Capillary refill normal to all digits.  No cyanosis or clubbing noted. Pedal hair growth normal.  Neurologic Normal speech. Oriented to person, place, and time. Epicritic sensation to light touch grossly present bilaterally.  Dermatologic Nails well groomed and normal in appearance. No open wounds. No skin lesions.  Orthopedic: Normal joint ROM without pain or crepitus bilaterally. No visible deformities. Mild tender to palpation at the calcaneal tuber right. No pain with calcaneal squeeze right. Ankle ROM diminished range of motion right. Silfverskiold Test: positive right.   Radiographs: None  Assessment:   1. Foot deformity, bilateral      Plan:  Patient was evaluated and treated and all questions answered.  Plantar Fasciitis, right with underlying gastrocnemius equinus - Clinically patient is able to manage the plantar fasciitis he would like to obtain another pair of orthotics.  He was casted for another pair of orthotics.  At this time I discussed more [place] options such as PRP injection or surgical options.  For now we will he will continue to manage the pain if it gets worse he will come back and see me.  Pes planovalgus/foot deformity -I explained to patient the etiology of pes planovalgus and relationship with Planter fasciitis and various treatment options were discussed.  Given patient foot structure  in the setting of Planter fasciitis I believe patient will benefit from custom-made orthotics to help control the hindfoot motion support the arch of the foot and take the stress away from plantar fascial.  Patient agrees with the plan like to proceed with orthotics -Patient was casted for orthotics   No follow-ups on file.

## 2024-05-16 ENCOUNTER — Other Ambulatory Visit: Payer: Self-pay | Admitting: Podiatry

## 2024-05-16 ENCOUNTER — Encounter: Payer: Self-pay | Admitting: Podiatry

## 2024-06-11 ENCOUNTER — Telehealth: Payer: Self-pay

## 2024-06-11 NOTE — Telephone Encounter (Signed)
 LVM to schedule orthotic fitting/ pu  Orthotics in [place] bin

## 2024-06-12 ENCOUNTER — Telehealth: Payer: Self-pay

## 2024-06-12 NOTE — Telephone Encounter (Signed)
 Patient's mother called in wanting to know if she could go by [facility] office one day to pick up her sons orthotics. Patient is apparently in [place] and only home for short periods. States she has picked up a pair for him before.

## 2024-09-02 ENCOUNTER — Encounter: Payer: Self-pay | Admitting: Podiatry
